# Patient Record
Sex: Female | Born: 1970 | Race: White | Hispanic: No | Marital: Married | Smoking: Current every day smoker
Health system: Southern US, Community
[De-identification: ages and names within clinical notes are randomized; demographics above are authoritative.]

## PROBLEM LIST (undated history)

## (undated) DIAGNOSIS — R200 Anesthesia of skin: Secondary | ICD-10-CM

## (undated) DIAGNOSIS — R51 Headache: Secondary | ICD-10-CM

## (undated) DIAGNOSIS — N390 Urinary tract infection, site not specified: Secondary | ICD-10-CM

## (undated) DIAGNOSIS — R928 Other abnormal and inconclusive findings on diagnostic imaging of breast: Secondary | ICD-10-CM

## (undated) DIAGNOSIS — I517 Cardiomegaly: Secondary | ICD-10-CM

## (undated) DIAGNOSIS — R202 Paresthesia of skin: Secondary | ICD-10-CM

## (undated) DIAGNOSIS — R519 Headache, unspecified: Secondary | ICD-10-CM

## (undated) DIAGNOSIS — K219 Gastro-esophageal reflux disease without esophagitis: Secondary | ICD-10-CM

## (undated) HISTORY — PX: COLONOSCOPY: SHX174

## (undated) HISTORY — PX: WISDOM TOOTH EXTRACTION: SHX21

## (undated) HISTORY — PX: CYST REMOVAL NECK: SHX6281

---

## 1998-03-01 ENCOUNTER — Inpatient Hospital Stay (HOSPITAL_COMMUNITY): Admission: AD | Admit: 1998-03-01 | Discharge: 1998-03-04 | Payer: Self-pay | Admitting: Obstetrics and Gynecology

## 1998-03-01 ENCOUNTER — Inpatient Hospital Stay (HOSPITAL_COMMUNITY): Admission: AD | Admit: 1998-03-01 | Discharge: 1998-03-01 | Payer: Self-pay | Admitting: Obstetrics and Gynecology

## 1998-04-09 ENCOUNTER — Other Ambulatory Visit: Admission: RE | Admit: 1998-04-09 | Discharge: 1998-04-09 | Payer: Self-pay | Admitting: Obstetrics and Gynecology

## 1998-09-19 ENCOUNTER — Emergency Department (HOSPITAL_COMMUNITY): Admission: EM | Admit: 1998-09-19 | Discharge: 1998-09-19 | Payer: Self-pay | Admitting: Emergency Medicine

## 1999-09-22 ENCOUNTER — Other Ambulatory Visit: Admission: RE | Admit: 1999-09-22 | Discharge: 1999-09-22 | Payer: Self-pay | Admitting: Obstetrics and Gynecology

## 2000-08-23 ENCOUNTER — Emergency Department (HOSPITAL_COMMUNITY): Admission: EM | Admit: 2000-08-23 | Discharge: 2000-08-23 | Payer: Self-pay | Admitting: Internal Medicine

## 2000-10-11 ENCOUNTER — Other Ambulatory Visit: Admission: RE | Admit: 2000-10-11 | Discharge: 2000-10-11 | Payer: Self-pay | Admitting: Obstetrics and Gynecology

## 2001-11-15 ENCOUNTER — Other Ambulatory Visit: Admission: RE | Admit: 2001-11-15 | Discharge: 2001-11-15 | Payer: Self-pay | Admitting: Obstetrics and Gynecology

## 2004-12-22 ENCOUNTER — Other Ambulatory Visit: Admission: RE | Admit: 2004-12-22 | Discharge: 2004-12-22 | Payer: Self-pay | Admitting: Obstetrics and Gynecology

## 2007-08-31 ENCOUNTER — Emergency Department (HOSPITAL_COMMUNITY): Admission: EM | Admit: 2007-08-31 | Discharge: 2007-08-31 | Payer: Self-pay | Admitting: Emergency Medicine

## 2010-11-25 ENCOUNTER — Other Ambulatory Visit: Payer: Self-pay

## 2010-11-25 ENCOUNTER — Other Ambulatory Visit (HOSPITAL_COMMUNITY)
Admission: RE | Admit: 2010-11-25 | Discharge: 2010-11-25 | Disposition: A | Discharge status: Home / Self Care | Payer: BC Managed Care – PPO | Source: Ambulatory Visit | Attending: Family Medicine | Admitting: Family Medicine

## 2010-11-25 DIAGNOSIS — Z01419 Encounter for gynecological examination (general) (routine) without abnormal findings: Secondary | ICD-10-CM | POA: Insufficient documentation

## 2011-02-03 ENCOUNTER — Other Ambulatory Visit: Payer: Self-pay | Admitting: Gastroenterology

## 2012-04-23 ENCOUNTER — Other Ambulatory Visit: Payer: Self-pay | Admitting: Physician Assistant

## 2012-04-23 DIAGNOSIS — Z1231 Encounter for screening mammogram for malignant neoplasm of breast: Secondary | ICD-10-CM

## 2012-05-16 ENCOUNTER — Ambulatory Visit
Admission: RE | Admit: 2012-05-16 | Discharge: 2012-05-16 | Disposition: A | Discharge status: Home / Self Care | Payer: BC Managed Care – PPO | Source: Ambulatory Visit | Attending: Physician Assistant | Admitting: Physician Assistant

## 2012-05-16 DIAGNOSIS — Z1231 Encounter for screening mammogram for malignant neoplasm of breast: Secondary | ICD-10-CM

## 2014-05-30 ENCOUNTER — Other Ambulatory Visit: Payer: Self-pay | Admitting: Gastroenterology

## 2017-08-21 NOTE — H&P (Signed)
46 year old G 6 P 1 presents with abnormal uterine bleeding.  Refractory to progesterone treatment Ultrasound unremarkable  No past medical history on file.  Past Surgical History: None  Meds: Prometrium  NKDA  There were no vitals taken for this visit.   General alert and oriented Lung CTAB Car RRR Abdomen is soft and non tender  Pelvic WNL  IMPRESSION: Abnormal uterine bleeding   PLAN LAVH Risks reviewed Consent signed

## 2017-09-05 NOTE — Patient Instructions (Signed)
Ashley Sawyer  09/05/2017      Your procedure is scheduled on Tuesday, Jan. 15, 2019   Report to [facility]  At  5:30 A.M.   Call this number if you have problems the morning of surgery:[phone]              OUR ADDRESS IS [place] , WE ARE LOCATED IN [place].    Remember:   Do not eat food or drink liquids after midnight.   Take these medicines the morning of surgery with A SIP OF WATER   Do not wear jewelry, make-up or nail polish.   Do not wear lotions, powders, or perfumes, or deoderant.   Do not shave 48 hours prior to surgery.  Men may shave face and neck.   Do not bring valuables to the hospital.   [facility] is not responsible for any belongings or valuables.  Contacts, dentures or bridgework may not be worn into surgery.  Leave your suitcase in the car.  After surgery it may be brought to your room.  For patients admitted to the hospital, discharge time will be determined by your treatment team.    Please read over the following fact sheets that you were given.    [place] - Preparing for Surgery Before surgery, you can play an important role.  Because skin is not sterile, your skin needs to be as free of germs as possible.  You can reduce the number of germs on your skin by washing with CHG (chlorahexidine gluconate) soap before surgery.  CHG is an antiseptic cleaner which kills germs and bonds with the skin to continue killing germs even after washing. Please DO NOT use if you have an allergy to CHG or antibacterial soaps.  If your skin becomes reddened/irritated stop using the CHG and inform your nurse when you arrive at Short Stay. Do not shave (including legs and underarms) for at least 48 hours prior to the first CHG shower.  You may shave your face/neck.  Please follow these instructions carefully:  1.  Shower with CHG Soap the night before surgery and the  morning of surgery.  2.   If you choose to wash your hair, wash your hair first as usual with your normal  shampoo.  3.  After you shampoo, rinse your hair and body thoroughly to remove the shampoo.                             4.  Use CHG as you would any other liquid soap.  You can apply chg directly to the skin and wash.  Gently with a scrungie or clean washcloth.  5.  Apply the CHG Soap to your body ONLY FROM THE NECK DOWN.   Do   not use on face/ open                           Wound or open sores. Avoid contact with eyes, ears mouth and   genitals (private parts).                       Wash face,  Genitals (private parts) with your normal soap.             6.  Wash thoroughly, paying special attention to the area where your    surgery  will be performed.  7.  Thoroughly rinse your body with warm water from the neck down.  8.  DO NOT shower/wash with your normal soap after using and rinsing off the CHG Soap.                9.  Pat yourself dry with a clean towel.            10.  Wear clean pajamas.            11.  Place clean sheets on your bed the night of your first shower and do not  sleep with pets. Day of Surgery : Do not apply any lotions/deodorants the morning of surgery.  Please wear clean clothes to the hospital/surgery center.  FAILURE TO FOLLOW THESE INSTRUCTIONS MAY RESULT IN THE CANCELLATION OF YOUR SURGERY  PATIENT SIGNATURE_________________________________  NURSE SIGNATURE__________________________________  ________________________________________________________________________   Ashley Sawyer  An incentive spirometer is a tool that can help keep your lungs clear and active. This tool measures how well you are filling your lungs with each breath. Taking long deep breaths may help reverse or decrease the chance of developing breathing (pulmonary) problems (especially infection) following:  A long period of time when you are unable to move or be active. BEFORE THE PROCEDURE   If the  spirometer includes an indicator to show your best effort, your nurse or respiratory therapist will set it to a desired goal.  If possible, sit up straight or lean slightly forward. Try not to slouch.  Hold the incentive spirometer in an upright position. INSTRUCTIONS FOR USE  1. Sit on the edge of your bed if possible, or sit up as far as you can in bed or on a chair. 2. Hold the incentive spirometer in an upright position. 3. Breathe out normally. 4. Place the mouthpiece in your mouth and seal your lips tightly around it. 5. Breathe in slowly and as deeply as possible, raising the piston or the ball toward the top of the column. 6. Hold your breath for 3-5 seconds or for as long as possible. Allow the piston or ball to fall to the bottom of the column. 7. Remove the mouthpiece from your mouth and breathe out normally. 8. Rest for a few seconds and repeat Steps 1 through 7 at least 10 times every 1-2 hours when you are awake. Take your time and take a few normal breaths between deep breaths. 9. The spirometer may include an indicator to show your best effort. Use the indicator as a goal to work toward during each repetition. 10. After each set of 10 deep breaths, practice coughing to be sure your lungs are clear. If you have an incision (the cut made at the time of surgery), support your incision when coughing by placing a pillow or rolled up towels firmly against it. Once you are able to get out of bed, walk around indoors and cough well. You may stop using the incentive spirometer when instructed by your caregiver.  RISKS AND COMPLICATIONS  Take your time so you do not get dizzy or light-headed.  If you are in pain, you may need to take or ask for pain medication before doing incentive spirometry. It is harder to take a deep breath if you are having pain. AFTER USE  Rest and breathe slowly and easily.  It can be helpful to keep track of a log of  your progress. Your caregiver can provide  you with a simple table to help with this. If you are using the spirometer at home, follow these instructions: Ashley Sawyer IF:   You are having difficultly using the spirometer.  You have trouble using the spirometer as often as instructed.  Your pain medication is not giving enough relief while using the spirometer.  You develop fever of 100.5 F (38.1 C) or higher. SEEK IMMEDIATE MEDICAL CARE IF:   You cough up bloody sputum that had not been present before.  You develop fever of 102 F (38.9 C) or greater.  You develop worsening pain at or near the incision site. MAKE SURE YOU:   Understand these instructions.  Will watch your condition.  Will get help right away if you are not doing well or get worse. Document Released: 12/26/2006 Document Revised: 11/07/2011 Document Reviewed: 02/26/2007 Exit[employer] Patient Information 2014 Exit[employer], [place].   ________________________________________________________________________  WHAT IS A BLOOD TRANSFUSION? Blood Transfusion Information  A transfusion is the replacement of blood or some of its parts. Blood is made up of multiple cells which provide different functions.  Red blood cells carry oxygen and are used for blood loss replacement.  White blood cells fight against infection.  Platelets control bleeding.  Plasma helps clot blood.  Other blood products are available for specialized needs, such as hemophilia or other clotting disorders. BEFORE THE TRANSFUSION  Who gives blood for transfusions?   Healthy volunteers who are fully evaluated to make sure their blood is safe. This is blood bank blood. Transfusion therapy is the safest it has ever been in the practice of medicine. Before blood is taken from a donor, a complete history is taken to make sure that person has no history of diseases nor engages in risky social behavior (examples are intravenous drug use or sexual activity with multiple partners). The donor's  travel history is screened to minimize risk of transmitting infections, such as malaria. The donated blood is tested for signs of infectious diseases, such as HIV and hepatitis. The blood is then tested to be sure it is compatible with you in order to minimize the chance of a transfusion reaction. If you or a relative donates blood, this is often done in anticipation of surgery and is not appropriate for emergency situations. It takes many days to process the donated blood. RISKS AND COMPLICATIONS Although transfusion therapy is very safe and saves many lives, the main dangers of transfusion include:   Getting an infectious disease.  Developing a transfusion reaction. This is an allergic reaction to something in the blood you were given. Every precaution is taken to prevent this. The decision to have a blood transfusion has been considered carefully by your caregiver before blood is given. Blood is not given unless the benefits outweigh the risks. AFTER THE TRANSFUSION  Right after receiving a blood transfusion, you will usually feel much better and more energetic. This is especially true if your red blood cells have gotten low (anemic). The transfusion raises the level of the red blood cells which carry oxygen, and this usually causes an energy increase.  The nurse administering the transfusion will monitor you carefully for complications. HOME CARE INSTRUCTIONS  No special instructions are needed after a transfusion. You may find your energy is better. Speak with your caregiver about any limitations on activity for underlying diseases you may have. SEEK MEDICAL CARE IF:   Your condition is not improving after your transfusion.  You develop redness  or irritation at the intravenous (IV) site. SEEK IMMEDIATE MEDICAL CARE IF:  Any of the following symptoms occur over the next 12 hours:  Shaking chills.  You have a temperature by mouth above 102 F (38.9 C), not controlled by  medicine.  Chest, back, or muscle pain.  People around you feel you are not acting correctly or are confused.  Shortness of breath or difficulty breathing.  Dizziness and fainting.  You get a rash or develop hives.  You have a decrease in urine output.  Your urine turns a dark color or changes to pink, red, or brown. Any of the following symptoms occur over the next 10 days:  You have a temperature by mouth above 102 F (38.9 C), not controlled by medicine.  Shortness of breath.  Weakness after normal activity.  The white part of the eye turns yellow (jaundice).  You have a decrease in the amount of urine or are urinating less often.  Your urine turns a dark color or changes to pink, red, or brown. Document Released: 08/12/2000 Document Revised: 11/07/2011 Document Reviewed: 03/31/2008 [facility] Patient Information 2014 [place], [place].  _______________________________________________________________________

## 2017-09-07 ENCOUNTER — Other Ambulatory Visit: Payer: Self-pay

## 2017-09-07 ENCOUNTER — Encounter (HOSPITAL_COMMUNITY): Payer: Self-pay

## 2017-09-07 ENCOUNTER — Encounter (INDEPENDENT_AMBULATORY_CARE_PROVIDER_SITE_OTHER): Payer: Self-pay

## 2017-09-07 ENCOUNTER — Encounter (HOSPITAL_COMMUNITY)
Admission: RE | Admit: 2017-09-07 | Discharge: 2017-09-07 | Disposition: A | Discharge status: Home / Self Care | Payer: 59 | Source: Ambulatory Visit | Attending: Obstetrics and Gynecology | Admitting: Obstetrics and Gynecology

## 2017-09-07 DIAGNOSIS — Z0183 Encounter for blood typing: Secondary | ICD-10-CM | POA: Diagnosis not present

## 2017-09-07 DIAGNOSIS — N939 Abnormal uterine and vaginal bleeding, unspecified: Secondary | ICD-10-CM | POA: Diagnosis not present

## 2017-09-07 DIAGNOSIS — Z01812 Encounter for preprocedural laboratory examination: Secondary | ICD-10-CM | POA: Insufficient documentation

## 2017-09-07 HISTORY — DX: Anesthesia of skin: R20.0

## 2017-09-07 HISTORY — DX: Gastro-esophageal reflux disease without esophagitis: K21.9

## 2017-09-07 HISTORY — DX: Headache: R51

## 2017-09-07 HISTORY — DX: Cardiomegaly: I51.7

## 2017-09-07 HISTORY — DX: Headache, unspecified: R51.9

## 2017-09-07 HISTORY — DX: Urinary tract infection, site not specified: N39.0

## 2017-09-07 HISTORY — DX: Paresthesia of skin: R20.2

## 2017-09-07 LAB — CBC
HCT: 36.6 % (ref 36.0–46.0)
HEMOGLOBIN: 11.9 g/dL — AB (ref 12.0–15.0)
MCH: 27.9 pg (ref 26.0–34.0)
MCHC: 32.5 g/dL (ref 30.0–36.0)
MCV: 85.7 fL (ref 78.0–100.0)
Platelets: 283 10*3/uL (ref 150–400)
RBC: 4.27 MIL/uL (ref 3.87–5.11)
RDW: 15.4 % (ref 11.5–15.5)
WBC: 6.8 10*3/uL (ref 4.0–10.5)

## 2017-09-08 LAB — ABO/RH: ABO/RH(D): A POS

## 2017-09-11 NOTE — Anesthesia Preprocedure Evaluation (Addendum)
Anesthesia Evaluation  Patient identified by MRN, date of birth, ID band Patient awake    Reviewed: Allergy & Precautions, NPO status , Patient's Chart, lab work & pertinent test results  Airway Mallampati: II  TM Distance: >3 FB Neck ROM: Full    Dental no notable dental hx. (+) Dental Advisory Given   Pulmonary neg pulmonary ROS, former smoker,    Pulmonary exam normal        Cardiovascular negative cardio ROS Normal cardiovascular exam     Neuro/Psych negative neurological ROS  negative psych ROS   GI/Hepatic Neg liver ROS, GERD  ,  Endo/Other  negative endocrine ROS  Renal/GU negative Renal ROS     Musculoskeletal negative musculoskeletal ROS (+)   Abdominal   Peds  Hematology negative hematology ROS (+)   Anesthesia Other Findings   Reproductive/Obstetrics negative OB ROS                            Anesthesia Physical Anesthesia Plan  ASA: II  Anesthesia Plan: General   Post-op Pain Management:    Induction: Intravenous  PONV Risk Score and Plan: 4 or greater and Treatment may vary due to age or medical condition, Ondansetron, Dexamethasone, Scopolamine patch - Pre-op and Diphenhydramine  Airway Management Planned: Oral ETT  Additional Equipment:   Intra-op Plan:   Post-operative Plan: Extubation in OR  Informed Consent: I have reviewed the patients History and Physical, chart, labs and discussed the procedure including the risks, benefits and alternatives for the proposed anesthesia with the patient or authorized representative who has indicated his/her understanding and acceptance.   Dental advisory given  Plan Discussed with: CRNA  Anesthesia Plan Comments:        Anesthesia Quick Evaluation

## 2017-09-12 ENCOUNTER — Encounter (HOSPITAL_BASED_OUTPATIENT_CLINIC_OR_DEPARTMENT_OTHER)
Admission: RE | Disposition: A | Discharge status: Home / Self Care | Payer: Self-pay | Source: Ambulatory Visit | Attending: Obstetrics and Gynecology

## 2017-09-12 ENCOUNTER — Ambulatory Visit (HOSPITAL_BASED_OUTPATIENT_CLINIC_OR_DEPARTMENT_OTHER): Payer: 59 | Admitting: Anesthesiology

## 2017-09-12 ENCOUNTER — Encounter (HOSPITAL_BASED_OUTPATIENT_CLINIC_OR_DEPARTMENT_OTHER): Payer: Self-pay

## 2017-09-12 ENCOUNTER — Ambulatory Visit (HOSPITAL_BASED_OUTPATIENT_CLINIC_OR_DEPARTMENT_OTHER)
Admission: RE | Admit: 2017-09-12 | Discharge: 2017-09-13 | Disposition: A | Discharge status: Home / Self Care | Payer: 59 | Source: Ambulatory Visit | Attending: Obstetrics and Gynecology | Admitting: Obstetrics and Gynecology

## 2017-09-12 DIAGNOSIS — Z79899 Other long term (current) drug therapy: Secondary | ICD-10-CM | POA: Diagnosis not present

## 2017-09-12 DIAGNOSIS — N939 Abnormal uterine and vaginal bleeding, unspecified: Secondary | ICD-10-CM | POA: Diagnosis not present

## 2017-09-12 DIAGNOSIS — N800 Endometriosis of uterus: Secondary | ICD-10-CM | POA: Insufficient documentation

## 2017-09-12 DIAGNOSIS — D259 Leiomyoma of uterus, unspecified: Secondary | ICD-10-CM | POA: Insufficient documentation

## 2017-09-12 DIAGNOSIS — Z87891 Personal history of nicotine dependence: Secondary | ICD-10-CM | POA: Insufficient documentation

## 2017-09-12 HISTORY — PX: LAPAROSCOPIC ASSISTED VAGINAL HYSTERECTOMY: SHX5398

## 2017-09-12 LAB — TYPE AND SCREEN
ABO/RH(D): A POS
ANTIBODY SCREEN: NEGATIVE

## 2017-09-12 LAB — POCT PREGNANCY, URINE: PREG TEST UR: NEGATIVE

## 2017-09-12 SURGERY — HYSTERECTOMY, VAGINAL, LAPAROSCOPY-ASSISTED
Anesthesia: General | Site: Vagina

## 2017-09-12 MED ORDER — SCOPOLAMINE 1 MG/3DAYS TD PT72
1.0000 | MEDICATED_PATCH | TRANSDERMAL | Status: DC
  Administered 2017-09-12: 1.5 mg via TRANSDERMAL
  Filled 2017-09-12: qty 1

## 2017-09-12 MED ORDER — LIDOCAINE HCL 4 % MT SOLN
OROMUCOSAL | Status: DC | PRN
  Administered 2017-09-12: 2 mL via TOPICAL

## 2017-09-12 MED ORDER — PROPOFOL 10 MG/ML IV BOLUS
INTRAVENOUS | Status: DC | PRN
  Administered 2017-09-12: 150 mg via INTRAVENOUS

## 2017-09-12 MED ORDER — ACETAMINOPHEN 500 MG PO TABS
ORAL_TABLET | ORAL | Status: AC
  Filled 2017-09-12: qty 1

## 2017-09-12 MED ORDER — METOCLOPRAMIDE HCL 5 MG/ML IJ SOLN
INTRAMUSCULAR | Status: AC
  Filled 2017-09-12: qty 2

## 2017-09-12 MED ORDER — LACTATED RINGERS IV SOLN
INTRAVENOUS | Status: DC
  Administered 2017-09-12 (×3): via INTRAVENOUS
  Filled 2017-09-12: qty 1000

## 2017-09-12 MED ORDER — DIPHENHYDRAMINE HCL 50 MG/ML IJ SOLN
12.5000 mg | Freq: Four times a day (QID) | INTRAMUSCULAR | Status: DC | PRN
  Filled 2017-09-12: qty 0.25

## 2017-09-12 MED ORDER — TRAMADOL HCL 50 MG PO TABS
ORAL_TABLET | ORAL | Status: AC
  Filled 2017-09-12: qty 2

## 2017-09-12 MED ORDER — MEPERIDINE HCL 25 MG/ML IJ SOLN
INTRAMUSCULAR | Status: AC
  Filled 2017-09-12: qty 1

## 2017-09-12 MED ORDER — BUPIVACAINE HCL (PF) 0.25 % IJ SOLN
INTRAMUSCULAR | Status: DC | PRN
  Administered 2017-09-12: 5 mL

## 2017-09-12 MED ORDER — PROPOFOL 10 MG/ML IV BOLUS
INTRAVENOUS | Status: AC
  Filled 2017-09-12: qty 40

## 2017-09-12 MED ORDER — FENTANYL CITRATE (PF) 250 MCG/5ML IJ SOLN
INTRAMUSCULAR | Status: AC
  Filled 2017-09-12: qty 5

## 2017-09-12 MED ORDER — ONDANSETRON HCL 4 MG/2ML IJ SOLN
INTRAMUSCULAR | Status: AC
  Filled 2017-09-12: qty 2

## 2017-09-12 MED ORDER — KETOROLAC TROMETHAMINE 30 MG/ML IJ SOLN
INTRAMUSCULAR | Status: AC
  Filled 2017-09-12: qty 1

## 2017-09-12 MED ORDER — SUGAMMADEX SODIUM 200 MG/2ML IV SOLN
INTRAVENOUS | Status: AC
  Filled 2017-09-12: qty 2

## 2017-09-12 MED ORDER — LIDOCAINE 2% (20 MG/ML) 5 ML SYRINGE
INTRAMUSCULAR | Status: AC
  Filled 2017-09-12: qty 10

## 2017-09-12 MED ORDER — ROCURONIUM BROMIDE 10 MG/ML (PF) SYRINGE
PREFILLED_SYRINGE | INTRAVENOUS | Status: DC | PRN
  Administered 2017-09-12: 20 mg via INTRAVENOUS
  Administered 2017-09-12: 50 mg via INTRAVENOUS

## 2017-09-12 MED ORDER — LACTATED RINGERS IV SOLN
INTRAVENOUS | Status: DC
  Filled 2017-09-12: qty 1000

## 2017-09-12 MED ORDER — KETOROLAC TROMETHAMINE 30 MG/ML IJ SOLN
INTRAMUSCULAR | Status: DC | PRN
  Administered 2017-09-12: 30 mg via INTRAVENOUS

## 2017-09-12 MED ORDER — ONDANSETRON HCL 4 MG/2ML IJ SOLN
INTRAMUSCULAR | Status: DC | PRN
  Administered 2017-09-12: 4 mg via INTRAVENOUS

## 2017-09-12 MED ORDER — ROCURONIUM BROMIDE 50 MG/5ML IV SOSY
PREFILLED_SYRINGE | INTRAVENOUS | Status: AC
  Filled 2017-09-12: qty 5

## 2017-09-12 MED ORDER — MENTHOL 3 MG MT LOZG
1.0000 | LOZENGE | OROMUCOSAL | Status: DC | PRN
  Filled 2017-09-12: qty 9

## 2017-09-12 MED ORDER — DEXAMETHASONE SODIUM PHOSPHATE 10 MG/ML IJ SOLN
INTRAMUSCULAR | Status: DC | PRN
  Administered 2017-09-12: 10 mg via INTRAVENOUS

## 2017-09-12 MED ORDER — METOCLOPRAMIDE HCL 5 MG/ML IJ SOLN
INTRAMUSCULAR | Status: DC | PRN
  Administered 2017-09-12 (×2): 5 mg via INTRAVENOUS

## 2017-09-12 MED ORDER — IBUPROFEN 600 MG PO TABS
600.0000 mg | ORAL_TABLET | Freq: Four times a day (QID) | ORAL | Status: DC | PRN
  Filled 2017-09-12: qty 1

## 2017-09-12 MED ORDER — KETOROLAC TROMETHAMINE 30 MG/ML IJ SOLN
30.0000 mg | Freq: Once | INTRAMUSCULAR | Status: AC
  Administered 2017-09-12: 30 mg via INTRAVENOUS
  Filled 2017-09-12: qty 1

## 2017-09-12 MED ORDER — FENTANYL CITRATE (PF) 100 MCG/2ML IJ SOLN
INTRAMUSCULAR | Status: DC | PRN
  Administered 2017-09-12: 100 ug via INTRAVENOUS
  Administered 2017-09-12 (×3): 50 ug via INTRAVENOUS

## 2017-09-12 MED ORDER — KETOROLAC TROMETHAMINE 30 MG/ML IJ SOLN
30.0000 mg | Freq: Once | INTRAMUSCULAR | Status: DC | PRN
  Filled 2017-09-12: qty 1

## 2017-09-12 MED ORDER — HYDROMORPHONE HCL 1 MG/ML IJ SOLN
0.2500 mg | INTRAMUSCULAR | Status: DC | PRN
  Administered 2017-09-12: 0.25 mg via INTRAVENOUS
  Filled 2017-09-12: qty 0.5

## 2017-09-12 MED ORDER — LIDOCAINE 2% (20 MG/ML) 5 ML SYRINGE
INTRAMUSCULAR | Status: DC | PRN
  Administered 2017-09-12: 60 mg via INTRAVENOUS

## 2017-09-12 MED ORDER — TRAMADOL HCL 50 MG PO TABS
ORAL_TABLET | ORAL | Status: AC
  Filled 2017-09-12: qty 1

## 2017-09-12 MED ORDER — DEXAMETHASONE SODIUM PHOSPHATE 10 MG/ML IJ SOLN
INTRAMUSCULAR | Status: AC
  Filled 2017-09-12: qty 1

## 2017-09-12 MED ORDER — DIPHENHYDRAMINE HCL 12.5 MG/5ML PO ELIX
12.5000 mg | ORAL_SOLUTION | Freq: Four times a day (QID) | ORAL | Status: DC | PRN
  Administered 2017-09-12: 12.5 mg via ORAL
  Filled 2017-09-12 (×3): qty 5

## 2017-09-12 MED ORDER — TRAMADOL HCL 50 MG PO TABS
50.0000 mg | ORAL_TABLET | Freq: Four times a day (QID) | ORAL | Status: DC | PRN
  Administered 2017-09-12 – 2017-09-13 (×3): 50 mg via ORAL
  Filled 2017-09-12: qty 1

## 2017-09-12 MED ORDER — ACETAMINOPHEN 325 MG PO TABS
ORAL_TABLET | ORAL | Status: DC | PRN
  Administered 2017-09-12: 1000 mg via ORAL

## 2017-09-12 MED ORDER — PROMETHAZINE HCL 25 MG/ML IJ SOLN
6.2500 mg | INTRAMUSCULAR | Status: DC | PRN
  Filled 2017-09-12: qty 1

## 2017-09-12 MED ORDER — ONDANSETRON HCL 4 MG/2ML IJ SOLN
4.0000 mg | Freq: Four times a day (QID) | INTRAMUSCULAR | Status: DC | PRN
  Filled 2017-09-12: qty 2

## 2017-09-12 MED ORDER — SODIUM CHLORIDE 0.9 % IR SOLN
Status: DC | PRN
  Administered 2017-09-12: 1000 mL

## 2017-09-12 MED ORDER — MIDAZOLAM HCL 2 MG/2ML IJ SOLN
INTRAMUSCULAR | Status: AC
  Filled 2017-09-12: qty 2

## 2017-09-12 MED ORDER — SCOPOLAMINE 1 MG/3DAYS TD PT72
MEDICATED_PATCH | TRANSDERMAL | Status: AC
  Filled 2017-09-12: qty 1

## 2017-09-12 MED ORDER — MIDAZOLAM HCL 2 MG/2ML IJ SOLN
INTRAMUSCULAR | Status: DC | PRN
  Administered 2017-09-12: 2 mg via INTRAVENOUS

## 2017-09-12 MED ORDER — HYDROMORPHONE HCL 1 MG/ML IJ SOLN
INTRAMUSCULAR | Status: AC
  Filled 2017-09-12: qty 1

## 2017-09-12 MED ORDER — ARTIFICIAL TEARS OPHTHALMIC OINT
TOPICAL_OINTMENT | OPHTHALMIC | Status: AC
  Filled 2017-09-12: qty 3.5

## 2017-09-12 MED ORDER — CEFOTETAN DISODIUM-DEXTROSE 2-2.08 GM-%(50ML) IV SOLR
INTRAVENOUS | Status: AC
  Filled 2017-09-12: qty 50

## 2017-09-12 MED ORDER — SUGAMMADEX SODIUM 200 MG/2ML IV SOLN
INTRAVENOUS | Status: DC | PRN
  Administered 2017-09-12: 150 mg via INTRAVENOUS

## 2017-09-12 MED ORDER — DEXTROSE 5 % IV SOLN
2.0000 g | INTRAVENOUS | Status: AC
  Administered 2017-09-12: 2 g via INTRAVENOUS
  Filled 2017-09-12: qty 2

## 2017-09-12 MED ORDER — MEPERIDINE HCL 25 MG/ML IJ SOLN
6.2500 mg | INTRAMUSCULAR | Status: DC | PRN
  Filled 2017-09-12: qty 1

## 2017-09-12 SURGICAL SUPPLY — 62 items
ADH SKN CLS APL DERMABOND .7 (GAUZE/BANDAGES/DRESSINGS) ×1
APL SRG 38 LTWT LNG FL B (MISCELLANEOUS) ×1
APPLICATOR ARISTA FLEXITIP XL (MISCELLANEOUS) ×2 IMPLANT
BARRIER ADHS 3X4 INTERCEED (GAUZE/BANDAGES/DRESSINGS) IMPLANT
BLADE CLIPPER SURG (BLADE) IMPLANT
BRR ADH 4X3 ABS CNTRL BYND (GAUZE/BANDAGES/DRESSINGS)
CANISTER SUCT 3000ML PPV (MISCELLANEOUS) ×2 IMPLANT
COVER BACK TABLE 80X110 HD (DRAPES) ×3 IMPLANT
COVER MAYO STAND STRL (DRAPES) ×6 IMPLANT
DERMABOND ADVANCED (GAUZE/BANDAGES/DRESSINGS) ×2
DERMABOND ADVANCED .7 DNX12 (GAUZE/BANDAGES/DRESSINGS) ×1 IMPLANT
DRSG COVADERM PLUS 2X2 (GAUZE/BANDAGES/DRESSINGS) IMPLANT
DRSG OPSITE POSTOP 3X4 (GAUZE/BANDAGES/DRESSINGS) ×3 IMPLANT
DURAPREP 26ML APPLICATOR (WOUND CARE) ×3 IMPLANT
ELECT REM PT RETURN 9FT ADLT (ELECTROSURGICAL) ×3
ELECTRODE REM PT RTRN 9FT ADLT (ELECTROSURGICAL) ×1 IMPLANT
GLOVE BIO SURGEON STRL SZ 6.5 (GLOVE) ×8 IMPLANT
GLOVE BIO SURGEON STRL SZ8.5 (GLOVE) ×4 IMPLANT
GLOVE BIO SURGEONS STRL SZ 6.5 (GLOVE) ×5
GLOVE BIOGEL PI IND STRL 6.5 (GLOVE) IMPLANT
GLOVE BIOGEL PI IND STRL 7.5 (GLOVE) IMPLANT
GLOVE BIOGEL PI IND STRL 8.5 (GLOVE) IMPLANT
GLOVE BIOGEL PI INDICATOR 6.5 (GLOVE) ×4
GLOVE BIOGEL PI INDICATOR 7.5 (GLOVE) ×4
GLOVE BIOGEL PI INDICATOR 8.5 (GLOVE) ×8
GLOVE ECLIPSE 6.5 STRL STRAW (GLOVE) ×6 IMPLANT
GOWN STRL REUS W/TWL LRG LVL3 (GOWN DISPOSABLE) ×4 IMPLANT
GOWN STRL REUS W/TWL XL LVL3 (GOWN DISPOSABLE) ×4 IMPLANT
HEMOSTAT ARISTA ABSORB 3G PWDR (MISCELLANEOUS) ×2 IMPLANT
HOLDER FOLEY CATH W/STRAP (MISCELLANEOUS) ×3 IMPLANT
IV SOD CHL 0.9% 1000ML (IV SOLUTION) ×2 IMPLANT
KIT RM TURNOVER CYSTO AR (KITS) ×3 IMPLANT
LEGGING LITHOTOMY PAIR STRL (DRAPES) ×3 IMPLANT
NDL INSUFFLATION 14GA 120MM (NEEDLE) ×1 IMPLANT
NEEDLE INSUFFLATION 14GA 120MM (NEEDLE) ×3 IMPLANT
NS IRRIG 500ML POUR BTL (IV SOLUTION) ×3 IMPLANT
PACK LAVH (CUSTOM PROCEDURE TRAY) ×3 IMPLANT
PACK ROBOTIC GOWN (GOWN DISPOSABLE) ×1 IMPLANT
PACK TRENDGUARD 450 HYBRID PRO (MISCELLANEOUS) IMPLANT
PAD OB MATERNITY 4.3X12.25 (PERSONAL CARE ITEMS) ×3 IMPLANT
PAD PREP 24X48 CUFFED NSTRL (MISCELLANEOUS) ×3 IMPLANT
SEALER TISSUE G2 CVD JAW 45CM (ENDOMECHANICALS) ×3 IMPLANT
SET IRRIG TUBING LAPAROSCOPIC (IRRIGATION / IRRIGATOR) ×3 IMPLANT
SUT VIC AB 0 CT1 18XCR BRD8 (SUTURE) ×2 IMPLANT
SUT VIC AB 0 CT1 36 (SUTURE) ×8 IMPLANT
SUT VIC AB 0 CT1 8-18 (SUTURE) ×9
SUT VIC AB 3-0 PS2 18 (SUTURE) ×3
SUT VIC AB 3-0 PS2 18XBRD (SUTURE) ×1 IMPLANT
SUT VIC AB 3-0 SH 27 (SUTURE)
SUT VIC AB 3-0 SH 27X BRD (SUTURE) IMPLANT
SUT VICRYL 0 TIES 12 18 (SUTURE) ×3 IMPLANT
SUT VICRYL 0 UR6 27IN ABS (SUTURE) ×3 IMPLANT
SYR BULB IRRIGATION 50ML (SYRINGE) IMPLANT
TOWEL OR 17X24 6PK STRL BLUE (TOWEL DISPOSABLE) ×6 IMPLANT
TRAY FOLEY CATH SILVER 14FR (SET/KITS/TRAYS/PACK) ×3 IMPLANT
TRENDGUARD 450 HYBRID PRO PACK (MISCELLANEOUS) ×3
TROCAR OPTI TIP 5M 100M (ENDOMECHANICALS) ×3 IMPLANT
TROCAR XCEL BLUNT TIP 100MML (ENDOMECHANICALS) IMPLANT
TROCAR XCEL DIL TIP R 11M (ENDOMECHANICALS) ×3 IMPLANT
TUBING INSUF HEATED (TUBING) ×3 IMPLANT
WARMER LAPAROSCOPE (MISCELLANEOUS) ×3 IMPLANT
WATER STERILE IRR 500ML POUR (IV SOLUTION) ×3 IMPLANT

## 2017-09-12 NOTE — Anesthesia Postprocedure Evaluation (Signed)
Anesthesia Post Note  Patient: Ashley Sawyer  Procedure(s) Performed: LAPAROSCOPIC ASSISTED VAGINAL HYSTERECTOMY (N/A Vagina )     Patient location during evaluation: PACU Anesthesia Type: General Level of consciousness: awake and alert Pain management: pain level controlled Vital Signs Assessment: post-procedure vital signs reviewed and stable Respiratory status: spontaneous breathing, nonlabored ventilation and respiratory function stable Cardiovascular status: blood pressure returned to baseline and stable Postop Assessment: no apparent nausea or vomiting Anesthetic complications: no    Last Vitals:  Vitals:   09/12/17 1100 09/12/17 1115  BP: 119/66 124/61  Pulse: 91 95  Resp: 15 18  Temp:  37.2 C  SpO2: 99% 99%    Last Pain:  Vitals:   09/12/17 1115  TempSrc:   PainSc: Berwind Brock

## 2017-09-12 NOTE — Progress Notes (Signed)
Patient sitting upright eating a sandwich.  BP 119/63 (BP Location: Left Arm)   Pulse (!) 105   Temp 98.3 F (36.8 C)   Resp 16   Ht 5\' 4"  (1.626 m)   Wt 70.7 kg (155 lb 14.4 oz)   LMP 08/25/2017 (Approximate)   SpO2 97%   BMI 26.76 kg/m  Results for orders placed or performed during the hospital encounter of 09/12/17 (from the past 24 hour(s))  Pregnancy, urine POC     Status: None   Collection Time: 09/12/17  6:06 AM  Result Value Ref Range   Preg Test, Ur NEGATIVE NEGATIVE   Abdomen soft and non tender Bandage clean and dry  POD # 0  Doing well Routine care Remove foley Discharge home tomorrow

## 2017-09-12 NOTE — Transfer of Care (Signed)
  Last Vitals:  Vitals:   09/12/17 0531 09/12/17 0923  BP: 130/73 129/77  Pulse: 81 97  Resp: 16 17  Temp: 36.6 C 36.7 C  SpO2: 97% 93%    Last Pain:  Vitals:   09/12/17 0531  TempSrc: Oral      Patients Stated Pain Goal: 4 (09/12/17 0546)  Immediate Anesthesia Transfer of Care Note  Patient: Ashley Sawyer  Procedure(s) Performed: Procedure(s) (LRB): LAPAROSCOPIC ASSISTED VAGINAL HYSTERECTOMY (N/A)  Patient Location: PACU  Anesthesia Type: General  Level of Consciousness: awake, alert  and oriented  Airway & Oxygen Therapy: Patient Spontanous Breathing and Patient connected to nasal cannula oxygen  Post-op Assessment: Report given to PACU RN and Post -op Vital signs reviewed and stable  Post vital signs: Reviewed and stable  Complications: No apparent anesthesia complications

## 2017-09-12 NOTE — Progress Notes (Signed)
No changes Will proceed with LAVH, BS Consent signed Risks reviewed

## 2017-09-12 NOTE — Brief Op Note (Signed)
09/12/2017  9:11 AM  PATIENT:  Ashley Sawyer  47 y.o. female  PRE-OPERATIVE DIAGNOSIS:  AUB   POST-OPERATIVE DIAGNOSIS:  AUB   PROCEDURE: LAVH Bilateral salpingectomy  SURGEON:  Surgeon(s) and Role:    * Dian Queen, MD - Primary    * Maisie Fus, MD - Assisting  PHYSICIAN ASSISTANT:   ASSISTANTS: none   ANESTHESIA:   general  EBL:  400 mL   BLOOD ADMINISTERED:none  DRAINS: Urinary Catheter (Foley)   LOCAL MEDICATIONS USED:  LIDOCAINE   SPECIMEN:  Source of Specimen:  cervix, uterus and fallopian tubes  DISPOSITION OF SPECIMEN:  PATHOLOGY  COUNTS:  YES  TOURNIQUET:  * No tourniquets in log *  DICTATION: .Other Dictation: Dictation Number [id number]  PLAN OF CARE: Admit to inpatient   PATIENT DISPOSITION:  PACU - hemodynamically stable.   Delay start of Pharmacological VTE agent (>24hrs) due to surgical blood loss or risk of bleeding: not applicable

## 2017-09-12 NOTE — Anesthesia Procedure Notes (Signed)
Procedure Name: Intubation Date/Time: 09/12/2017 7:29 AM Performed by: Duane Boston, MD Pre-anesthesia Checklist: Patient identified, Emergency Drugs available, Suction available and Patient being monitored Patient Re-evaluated:Patient Re-evaluated prior to induction Oxygen Delivery Method: Circle system utilized Preoxygenation: Pre-oxygenation with 100% oxygen Induction Type: IV induction Ventilation: Mask ventilation without difficulty Laryngoscope Size: Mac and 3 Grade View: Grade I Tube type: Oral Tube size: 7.0 mm Number of attempts: 1 Airway Equipment and Method: Stylet and LTA kit utilized Placement Confirmation: ETT inserted through vocal cords under direct vision,  positive ETCO2 and breath sounds checked- equal and bilateral Secured at: 21 cm Tube secured with: Tape Dental Injury: Teeth and Oropharynx as per pre-operative assessment

## 2017-09-13 ENCOUNTER — Encounter (HOSPITAL_BASED_OUTPATIENT_CLINIC_OR_DEPARTMENT_OTHER): Payer: Self-pay | Admitting: Obstetrics and Gynecology

## 2017-09-13 DIAGNOSIS — N939 Abnormal uterine and vaginal bleeding, unspecified: Secondary | ICD-10-CM | POA: Diagnosis not present

## 2017-09-13 LAB — CBC
HEMATOCRIT: 28.3 % — AB (ref 36.0–46.0)
Hemoglobin: 9.3 g/dL — ABNORMAL LOW (ref 12.0–15.0)
MCH: 28.3 pg (ref 26.0–34.0)
MCHC: 32.9 g/dL (ref 30.0–36.0)
MCV: 86 fL (ref 78.0–100.0)
Platelets: 233 10*3/uL (ref 150–400)
RBC: 3.29 MIL/uL — ABNORMAL LOW (ref 3.87–5.11)
RDW: 15.5 % (ref 11.5–15.5)
WBC: 17 10*3/uL — ABNORMAL HIGH (ref 4.0–10.5)

## 2017-09-13 MED ORDER — TRAMADOL HCL 50 MG PO TABS
ORAL_TABLET | ORAL | Status: AC
  Filled 2017-09-13: qty 1

## 2017-09-13 MED ORDER — TRAMADOL HCL 50 MG PO TABS: 50.0000 mg | ORAL_TABLET | Freq: Four times a day (QID) | ORAL | 0 refills | Status: DC | PRN

## 2017-09-13 NOTE — Discharge Summary (Signed)
Admission Diagnosis: Abnormal uterine bleeding  Discharge Diagnosis: Same  Hospital course: 47 year old female admitted for LAVH, BS. She did very well post op. By POD # 1 she was ambulating, voiding and tolerating pain with only Tramadol  BP 109/65 (BP Location: Left Arm)   Pulse 90   Temp 98.7 F (37.1 C) (Oral)   Resp 16   Ht 5\' 4"  (1.626 m)   Wt 70.7 kg (155 lb 14.4 oz)   LMP 08/25/2017 (Approximate)   SpO2 97%   BMI 26.76 kg/m  Abdomen soft and non tender  Results for orders placed or performed during the hospital encounter of 09/12/17 (from the past 24 hour(s))  CBC     Status: Abnormal   Collection Time: 09/13/17  5:15 AM  Result Value Ref Range   WBC 17.0 (H) 4.0 - 10.5 K/uL   RBC 3.29 (L) 3.87 - 5.11 MIL/uL   Hemoglobin 9.3 (L) 12.0 - 15.0 g/dL   HCT 28.3 (L) 36.0 - 46.0 %   MCV 86.0 78.0 - 100.0 fL   MCH 28.3 26.0 - 34.0 pg   MCHC 32.9 30.0 - 36.0 g/dL   RDW 15.5 11.5 - 15.5 %   Platelets 233 150 - 400 K/uL   She was discharged home in good condition Follow up in 1 week Rx Tramadol

## 2017-09-13 NOTE — Op Note (Signed)
NAME:  Ashley Sawyer, Ashley Sawyer NO.:  [id number]  MEDICAL RECORD NO.:  [id number]  LOCATION:                                 FACILITY:  PHYSICIAN:  Nupur Hohman L. Helane Rima, M.D.    DATE OF BIRTH:  DATE OF PROCEDURE:  09/12/2017 DATE OF DISCHARGE:                              OPERATIVE REPORT   PREOPERATIVE DIAGNOSIS:  Abnormal uterine bleeding.  POSTOPERATIVE DIAGNOSIS:  Abnormal uterine bleeding.  PROCEDURE:  Laparoscopic-assisted vaginal hysterectomy and bilateral salpingectomy.  SURGEON:  Welby Montminy L. Helane Rima, M.D.  ASSISTANTMaisie Fus, M.D.  ANESTHESIA:  General.  EBL:  400 mL.  COMPLICATIONS:  None.  DRAINS:  Foley catheter.  PATHOLOGY:  Uterus, cervix, tubes sent to Pathology.  DESCRIPTION OF PROCEDURE:  The patient was taken to the operating room. After informed consent was obtained, she was prepped and draped.  After she was intubated, a Foley catheter was inserted and uterine manipulator was inserted.  Local was infiltrated at the umbilicus, a small incision was made.  The Veress needle was inserted and intraperitoneal placement was confirmed.  Pneumoperitoneum was performed.  An 11-mm trocar was inserted.  The scope was inserted through the trocar and pelvis was inspected.  The pelvis was normal.  Ovaries appeared normal.  No endometriosis was noted.  The patient was gently placed in Trendelenburg.  A small suprapubic incision was made and a 5-mm trocar was inserted under direct visualization.  The uterus was elevated.  The right tube was grasped with __________ grasper and the EnSeal was placed just beneath the mesosalpinx and it was carried down to the triple pedicle into the round on the right and on the left with excellent hemostasis.  We then released the pneumoperitoneum, went down to the vagina, placed a weighted speculum in the vagina, made a circumferential incision around the cervix and the posterior and anterior cul-de-sacs using  Metzenbaum scissors.  The curved Haney clamp staying snug beside the cervix and uterus, clamping the uterosacral, cardinal ligament complexes on each side.  Each pedicle was clamped, cut and suture ligated using 0 Vicryl suture.  We then made our way up to the top of the uterus carefully.  We then retroflexed the uterus and then removed the remainder of the pedicle on either side.  Each pedicle was secured using a suture ligature of 0 Vicryl suture.  We then placed angle stitches at the 3 o'clock and 9 o'clock position at the vaginal cuff using a figure-of-eight.  We then closed the posterior cuff imbricating peritoneum to the vaginal epithelium with a running stitch using 0 Vicryl suture and then we closed the cuff completely anterior to posterior using 0 Vicryl suture in a running locked stitch.  We then went back up to the abdomen, replaced the pneumoperitoneum using Nezhat irrigator, irrigated the pelvis.  The pelvis was very dry.  All pedicles looked good.  I then placed some Arista across the vaginal cough and then, we released the pneumoperitoneum and removed the trocars and then closed each incision with a stitch of 3-0 Vicryl, and then applied Dermabond to each incision.  All sponge, lap and instrument counts were correct x2.  The patient  was extubated and went to recovery room in stable condition.     [name] L. Helane Rima, M.D.     Nevin Bloodgood  D:  09/12/2017  T:  09/13/2017  Job:  [id number]

## 2018-03-12 ENCOUNTER — Other Ambulatory Visit: Payer: Self-pay | Admitting: Obstetrics and Gynecology

## 2018-03-12 DIAGNOSIS — R928 Other abnormal and inconclusive findings on diagnostic imaging of breast: Secondary | ICD-10-CM

## 2018-03-19 ENCOUNTER — Other Ambulatory Visit: Payer: Self-pay | Admitting: Obstetrics and Gynecology

## 2018-03-19 ENCOUNTER — Ambulatory Visit
Admission: RE | Admit: 2018-03-19 | Discharge: 2018-03-19 | Disposition: A | Discharge status: Home / Self Care | Payer: 59 | Source: Ambulatory Visit | Attending: Obstetrics and Gynecology | Admitting: Obstetrics and Gynecology

## 2018-03-19 DIAGNOSIS — N6489 Other specified disorders of breast: Secondary | ICD-10-CM

## 2018-03-19 DIAGNOSIS — R928 Other abnormal and inconclusive findings on diagnostic imaging of breast: Secondary | ICD-10-CM

## 2018-03-26 ENCOUNTER — Ambulatory Visit
Admission: RE | Admit: 2018-03-26 | Discharge: 2018-03-26 | Disposition: A | Discharge status: Home / Self Care | Payer: 59 | Source: Ambulatory Visit | Attending: Obstetrics and Gynecology | Admitting: Obstetrics and Gynecology

## 2018-03-26 DIAGNOSIS — N6489 Other specified disorders of breast: Secondary | ICD-10-CM

## 2018-03-29 HISTORY — PX: BREAST EXCISIONAL BIOPSY: SUR124

## 2018-04-10 ENCOUNTER — Other Ambulatory Visit: Payer: Self-pay | Admitting: General Surgery

## 2018-04-10 DIAGNOSIS — R928 Other abnormal and inconclusive findings on diagnostic imaging of breast: Secondary | ICD-10-CM

## 2018-04-11 ENCOUNTER — Other Ambulatory Visit: Payer: Self-pay | Admitting: General Surgery

## 2018-04-11 DIAGNOSIS — R928 Other abnormal and inconclusive findings on diagnostic imaging of breast: Secondary | ICD-10-CM

## 2018-04-17 ENCOUNTER — Encounter (HOSPITAL_BASED_OUTPATIENT_CLINIC_OR_DEPARTMENT_OTHER): Payer: Self-pay | Admitting: *Deleted

## 2018-04-17 ENCOUNTER — Other Ambulatory Visit: Payer: Self-pay

## 2018-04-22 NOTE — H&P (Signed)
Ashley Sawyer Location: [facility] Patient #: [id number] DOB: 08-26-1971 Married / Language: English / Race: White Female       History of Present Illness    . This is a pleasant 47 year old female, referred by Dr. Lovey Newcomer at the [facility] for an abnormal mammogram and discordant biopsy of the right breast upper outer quadrant. Ashley Sawyer is her gynecologist. Ashley Sawyer is her PCP. Ashley Sawyer served as my chaperone throughout the encounter      She has no prior history of breast problems. Recent screening mammogram showed a focal area of distortion in the right breast upper outer quadrant. Ultrasound the axilla was negative. Image guided biopsy showed fibrocystic changes and PAS H. This was felt to be discordant and excision was recommended by the radiologist.      Past history reveals vaginal hysterectomy in January 2019 for abnormal uterine bleeding. Benign. Mild anxiety and depression well controlled Family history reveals mother had breast cancer at age 51. Had COPD. Died of other causes. There is no family history of ovarian cancer or prostate cancer Social history reveals she is a former smoker. Occasional alcohol. Married. One daughter. 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      I had a long discussion with her. I told her that her risk of breast cancer was not that great. Maybe 5%. Clearly less than 10%. Her mother's history of breast cancer does weigh in on this. Her options are excision or active surveillance every 6 months. In the end she decided to have this area excised. I told her we would try to make an incision in the lateral breast as small as possible. She was concerned about cosmesis. She will be scheduled for right breast lumpectomy with radioactive seed localization. I discussed the indications, details, techniques, and numerous risk of the surgery with her. She is aware of the risk of bleeding, infection,  cosmetic deformity, reoperation of cancer, nerve damage with chronic pain. She understands all these issues. All her questions are answered. She agrees with this plan.   Past Surgical History  Breast Biopsy  Right. Colon Polyp Removal - Colonoscopy  Hysterectomy (not due to cancer) - Partial   Diagnostic Studies History  Mammogram  within last year Pap Smear  1-5 years ago  Allergies  No Known Drug Allergies Allergies Reconciled   Medication History Progesterone Micronized (100MG  Capsule, Oral) Active. ValACYclovir HCl (1GM Tablet, Oral) Active. Medications Reconciled  Social History Alcohol use  Occasional alcohol use. Caffeine use  Carbonated beverages, Tea. Illicit drug use  Remotely quit drug use. Tobacco use  Former smoker.  Family History  Alcohol Abuse  Brother. Breast Cancer  Mother. Colon Polyps  Mother, Sister. Diabetes Mellitus  Father. Heart disease in female family member before age 67  Respiratory Condition  Mother. Seizure disorder  Daughter.  Pregnancy / Birth History  Age at menarche  89 years. Contraceptive History  Oral contraceptives. Gravida  5 Irregular periods  Length (months) of breastfeeding  >24 Maternal age  47-20 Para  1  Other Problems  Gastroesophageal Reflux Disease  Hemorrhoids  Migraine Headache     Review of Systems  General Not Present- Appetite Loss, Chills, Fatigue, Fever, Night Sweats, Weight Gain and Weight Loss. Skin Not Present- Change in Wart/Mole, Dryness, Hives, Jaundice, New Lesions, Non-Healing Wounds, Rash and Ulcer. HEENT Present- Wears glasses/contact lenses. Not Present- Earache, Hearing Loss, Hoarseness, Nose Bleed, Oral Ulcers, Ringing in the Ears, Seasonal Allergies, Sinus Pain,  Sore Throat, Visual Disturbances and Yellow Eyes. Respiratory Not Present- Bloody sputum, Chronic Cough, Difficulty Breathing, Snoring and Wheezing. Breast Present- Breast Mass. Not Present- Breast  Pain, Nipple Discharge and Skin Changes. Cardiovascular Not Present- Chest Pain, Difficulty Breathing Lying Down, Leg Cramps, Palpitations, Rapid Heart Rate, Shortness of Breath and Swelling of Extremities. Gastrointestinal Not Present- Abdominal Pain, Bloating, Bloody Stool, Change in Bowel Habits, Chronic diarrhea, Constipation, Difficulty Swallowing, Excessive gas, Gets full quickly at meals, Hemorrhoids, Indigestion, Nausea, Rectal Pain and Vomiting. Female Genitourinary Not Present- Frequency, Nocturia, Painful Urination, Pelvic Pain and Urgency. Musculoskeletal Not Present- Back Pain, Joint Pain, Joint Stiffness, Muscle Pain, Muscle Weakness and Swelling of Extremities. Neurological Not Present- Decreased Memory, Fainting, Headaches, Numbness, Seizures, Tingling, Tremor, Trouble walking and Weakness. Psychiatric Not Present- Anxiety, Bipolar, Change in Sleep Pattern, Depression, Fearful and Frequent crying. Endocrine Not Present- Cold Intolerance, Excessive Hunger, Hair Changes, Heat Intolerance, Hot flashes and New Diabetes. Hematology Present- Easy Bruising. Not Present- Blood Thinners, Excessive bleeding, Gland problems, HIV and Persistent Infections.  Vitals  Weight: 158.2 lb Height: 64in Body Surface Area: 1.77 m Body Mass Index: 27.15 kg/m  Temp.: 98.87F  Pulse: 74 (Regular)  BP: 118/74 (Sitting, Left Arm, Standard)     Physical Exam  General Mental Status-Alert. General Appearance-Consistent with stated age. Hydration-Well hydrated. Voice-Normal.  Head and Neck Head-normocephalic, atraumatic with no lesions or palpable masses. Trachea-midline. Thyroid Gland Characteristics - normal size and consistency.  Eye Eyeball - Bilateral-Extraocular movements intact. Sclera/Conjunctiva - Bilateral-No scleral icterus.  Chest and Lung Exam Chest and lung exam reveals -quiet, even and easy respiratory effort with no use of accessory muscles and on  auscultation, normal breath sounds, no adventitious sounds and normal vocal resonance. Inspection Chest Wall - Normal. Back - normal.  Breast Note: Breasts are medium to large in size. Biopsy site on right lateral breast noted. No hematoma. No mass in either breast. No other skin changes. No axillary adenopathy.   Cardiovascular Cardiovascular examination reveals -normal heart sounds, regular rate and rhythm with no murmurs and normal pedal pulses bilaterally.  Abdomen Inspection Inspection of the abdomen reveals - No Hernias. Skin - Scar - no surgical scars. Palpation/Percussion Palpation and Percussion of the abdomen reveal - Soft, Non Tender, No Rebound tenderness, No Rigidity (guarding) and No hepatosplenomegaly. Auscultation Auscultation of the abdomen reveals - Bowel sounds normal.  Neurologic Neurologic evaluation reveals -alert and oriented x 3 with no impairment of recent or remote memory. Mental Status-Normal.  Musculoskeletal Normal Exam - Left-Upper Extremity Strength Normal and Lower Extremity Strength Normal. Normal Exam - Right-Upper Extremity Strength Normal and Lower Extremity Strength Normal.  Lymphatic Head & Neck  General Head & Neck Lymphatics: Bilateral - Description - Normal. Axillary  General Axillary Region: Bilateral - Description - Normal. Tenderness - Non Tender. Femoral & Inguinal  Generalized Femoral & Inguinal Lymphatics: Bilateral - Description - Normal. Tenderness - Non Tender.     Assessment & Plan ABNORMAL MAMMOGRAM OF RIGHT BREAST (R92.8)    Your recent mammograms and ultrasound show a focal area of distortion in the right breast, upper outer quadrant Biopsy shows benign changes and hyperplasia The radiologist is concerned that this does not correlate and that you need this area excised to prove that you do not have cancer Given the fact that your mother had breast cancer, your risk of having breast cancer is probably  somewhere between 5 and 10%. Most likely you do not have cancer  After lengthy discussion you decided to go  ahead with a right breast lumpectomy with radioactive seed localization We have discussed the indications, techniques, and numerous risk of the surgery in detail  FORMER SMOKER [phone]) [place] ([id number])    Edsel Petrin. Dalbert Batman, M.D., [facility] General and Minimally invasive Surgery Breast and Colorectal Surgery Office:   [phone] Pager:   [phone]

## 2018-04-24 ENCOUNTER — Ambulatory Visit
Admission: RE | Admit: 2018-04-24 | Discharge: 2018-04-24 | Disposition: A | Discharge status: Home / Self Care | Payer: 59 | Source: Ambulatory Visit | Attending: General Surgery | Admitting: General Surgery

## 2018-04-24 DIAGNOSIS — R928 Other abnormal and inconclusive findings on diagnostic imaging of breast: Secondary | ICD-10-CM

## 2018-04-24 NOTE — Progress Notes (Signed)
Ensure pre surgery drink given with instructions to complete by 0800 dos, surgical soap given with instructions, pt verbalized understanding. 

## 2018-04-25 ENCOUNTER — Other Ambulatory Visit: Payer: Self-pay

## 2018-04-25 ENCOUNTER — Encounter (HOSPITAL_BASED_OUTPATIENT_CLINIC_OR_DEPARTMENT_OTHER)
Admission: RE | Disposition: A | Discharge status: Home / Self Care | Payer: Self-pay | Source: Ambulatory Visit | Attending: General Surgery

## 2018-04-25 ENCOUNTER — Ambulatory Visit (HOSPITAL_BASED_OUTPATIENT_CLINIC_OR_DEPARTMENT_OTHER)
Admission: RE | Admit: 2018-04-25 | Discharge: 2018-04-25 | Disposition: A | Discharge status: Home / Self Care | Payer: 59 | Source: Ambulatory Visit | Attending: General Surgery | Admitting: General Surgery

## 2018-04-25 ENCOUNTER — Encounter (HOSPITAL_BASED_OUTPATIENT_CLINIC_OR_DEPARTMENT_OTHER): Payer: Self-pay | Admitting: Anesthesiology

## 2018-04-25 ENCOUNTER — Ambulatory Visit (HOSPITAL_BASED_OUTPATIENT_CLINIC_OR_DEPARTMENT_OTHER): Payer: 59 | Admitting: Anesthesiology

## 2018-04-25 ENCOUNTER — Ambulatory Visit
Admission: RE | Admit: 2018-04-25 | Discharge: 2018-04-25 | Disposition: A | Discharge status: Home / Self Care | Payer: 59 | Source: Ambulatory Visit | Attending: General Surgery | Admitting: General Surgery

## 2018-04-25 DIAGNOSIS — Z79890 Hormone replacement therapy (postmenopausal): Secondary | ICD-10-CM | POA: Insufficient documentation

## 2018-04-25 DIAGNOSIS — F419 Anxiety disorder, unspecified: Secondary | ICD-10-CM | POA: Insufficient documentation

## 2018-04-25 DIAGNOSIS — Z803 Family history of malignant neoplasm of breast: Secondary | ICD-10-CM | POA: Insufficient documentation

## 2018-04-25 DIAGNOSIS — N6011 Diffuse cystic mastopathy of right breast: Secondary | ICD-10-CM | POA: Diagnosis not present

## 2018-04-25 DIAGNOSIS — Z8371 Family history of colonic polyps: Secondary | ICD-10-CM | POA: Diagnosis not present

## 2018-04-25 DIAGNOSIS — R928 Other abnormal and inconclusive findings on diagnostic imaging of breast: Secondary | ICD-10-CM | POA: Diagnosis present

## 2018-04-25 DIAGNOSIS — N6081 Other benign mammary dysplasias of right breast: Secondary | ICD-10-CM | POA: Insufficient documentation

## 2018-04-25 DIAGNOSIS — Z79899 Other long term (current) drug therapy: Secondary | ICD-10-CM | POA: Diagnosis not present

## 2018-04-25 DIAGNOSIS — Z87891 Personal history of nicotine dependence: Secondary | ICD-10-CM | POA: Insufficient documentation

## 2018-04-25 HISTORY — DX: Other abnormal and inconclusive findings on diagnostic imaging of breast: R92.8

## 2018-04-25 HISTORY — PX: BREAST LUMPECTOMY WITH RADIOACTIVE SEED LOCALIZATION: SHX6424

## 2018-04-25 SURGERY — BREAST LUMPECTOMY WITH RADIOACTIVE SEED LOCALIZATION
Anesthesia: General | Site: Breast | Laterality: Right

## 2018-04-25 MED ORDER — MIDAZOLAM HCL 2 MG/2ML IJ SOLN
1.0000 mg | INTRAMUSCULAR | Status: DC | PRN
  Administered 2018-04-25: 2 mg via INTRAVENOUS

## 2018-04-25 MED ORDER — ACETAMINOPHEN 500 MG PO TABS
ORAL_TABLET | ORAL | Status: AC
  Filled 2018-04-25: qty 2

## 2018-04-25 MED ORDER — DEXAMETHASONE SODIUM PHOSPHATE 10 MG/ML IJ SOLN
INTRAMUSCULAR | Status: AC
  Filled 2018-04-25: qty 1

## 2018-04-25 MED ORDER — ONDANSETRON HCL 4 MG/2ML IJ SOLN
INTRAMUSCULAR | Status: DC | PRN
  Administered 2018-04-25: 4 mg via INTRAVENOUS

## 2018-04-25 MED ORDER — CHLORHEXIDINE GLUCONATE CLOTH 2 % EX PADS: 6.0000 | MEDICATED_PAD | Freq: Once | CUTANEOUS | Status: DC

## 2018-04-25 MED ORDER — CEFAZOLIN SODIUM-DEXTROSE 2-4 GM/100ML-% IV SOLN
INTRAVENOUS | Status: AC
  Filled 2018-04-25: qty 100

## 2018-04-25 MED ORDER — GABAPENTIN 300 MG PO CAPS
300.0000 mg | ORAL_CAPSULE | ORAL | Status: AC
  Administered 2018-04-25: 300 mg via ORAL

## 2018-04-25 MED ORDER — FENTANYL CITRATE (PF) 100 MCG/2ML IJ SOLN
INTRAMUSCULAR | Status: AC
  Filled 2018-04-25: qty 2

## 2018-04-25 MED ORDER — LIDOCAINE HCL (CARDIAC) PF 100 MG/5ML IV SOSY
PREFILLED_SYRINGE | INTRAVENOUS | Status: DC | PRN
  Administered 2018-04-25: 80 mg via INTRAVENOUS

## 2018-04-25 MED ORDER — CELECOXIB 200 MG PO CAPS
ORAL_CAPSULE | ORAL | Status: AC
  Filled 2018-04-25: qty 1

## 2018-04-25 MED ORDER — TRAMADOL HCL 50 MG PO TABS: 50.0000 mg | ORAL_TABLET | Freq: Four times a day (QID) | ORAL | 0 refills | Status: AC | PRN

## 2018-04-25 MED ORDER — PROPOFOL 10 MG/ML IV BOLUS
INTRAVENOUS | Status: DC | PRN
  Administered 2018-04-25: 150 mg via INTRAVENOUS

## 2018-04-25 MED ORDER — METOCLOPRAMIDE HCL 5 MG/ML IJ SOLN: 10.0000 mg | Freq: Once | INTRAMUSCULAR | Status: DC | PRN

## 2018-04-25 MED ORDER — FENTANYL CITRATE (PF) 100 MCG/2ML IJ SOLN
50.0000 ug | INTRAMUSCULAR | Status: DC | PRN
  Administered 2018-04-25: 50 ug via INTRAVENOUS

## 2018-04-25 MED ORDER — LACTATED RINGERS IV SOLN: INTRAVENOUS | Status: DC

## 2018-04-25 MED ORDER — FENTANYL CITRATE (PF) 100 MCG/2ML IJ SOLN: 25.0000 ug | INTRAMUSCULAR | Status: DC | PRN

## 2018-04-25 MED ORDER — CELECOXIB 200 MG PO CAPS
200.0000 mg | ORAL_CAPSULE | ORAL | Status: AC
  Administered 2018-04-25: 200 mg via ORAL

## 2018-04-25 MED ORDER — GABAPENTIN 300 MG PO CAPS
ORAL_CAPSULE | ORAL | Status: AC
  Filled 2018-04-25: qty 1

## 2018-04-25 MED ORDER — ONDANSETRON HCL 4 MG/2ML IJ SOLN
INTRAMUSCULAR | Status: AC
  Filled 2018-04-25: qty 2

## 2018-04-25 MED ORDER — LACTATED RINGERS IV SOLN
INTRAVENOUS | Status: DC
  Administered 2018-04-25: 11:00:00 via INTRAVENOUS

## 2018-04-25 MED ORDER — DEXAMETHASONE SODIUM PHOSPHATE 4 MG/ML IJ SOLN
INTRAMUSCULAR | Status: DC | PRN
  Administered 2018-04-25: 10 mg via INTRAVENOUS

## 2018-04-25 MED ORDER — MIDAZOLAM HCL 2 MG/2ML IJ SOLN
INTRAMUSCULAR | Status: AC
  Filled 2018-04-25: qty 2

## 2018-04-25 MED ORDER — BUPIVACAINE-EPINEPHRINE (PF) 0.5% -1:200000 IJ SOLN
INTRAMUSCULAR | Status: DC | PRN
  Administered 2018-04-25: 10 mL

## 2018-04-25 MED ORDER — SCOPOLAMINE 1 MG/3DAYS TD PT72: 1.0000 | MEDICATED_PATCH | Freq: Once | TRANSDERMAL | Status: DC | PRN

## 2018-04-25 MED ORDER — ACETAMINOPHEN 500 MG PO TABS
1000.0000 mg | ORAL_TABLET | ORAL | Status: AC
  Administered 2018-04-25: 1000 mg via ORAL

## 2018-04-25 MED ORDER — CEFAZOLIN SODIUM-DEXTROSE 2-4 GM/100ML-% IV SOLN
2.0000 g | INTRAVENOUS | Status: AC
  Administered 2018-04-25: 2 g via INTRAVENOUS

## 2018-04-25 MED ORDER — MEPERIDINE HCL 25 MG/ML IJ SOLN: 6.2500 mg | INTRAMUSCULAR | Status: DC | PRN

## 2018-04-25 SURGICAL SUPPLY — 64 items
ADH SKN CLS APL DERMABOND .7 (GAUZE/BANDAGES/DRESSINGS) ×1
APL SKNCLS STERI-STRIP NONHPOA (GAUZE/BANDAGES/DRESSINGS)
APPLIER CLIP 9.375 MED OPEN (MISCELLANEOUS)
APR CLP MED 9.3 20 MLT OPN (MISCELLANEOUS)
BENZOIN TINCTURE PRP APPL 2/3 (GAUZE/BANDAGES/DRESSINGS) IMPLANT
BINDER BREAST LRG (GAUZE/BANDAGES/DRESSINGS) ×2 IMPLANT
BINDER BREAST MEDIUM (GAUZE/BANDAGES/DRESSINGS) IMPLANT
BINDER BREAST XLRG (GAUZE/BANDAGES/DRESSINGS) IMPLANT
BINDER BREAST XXLRG (GAUZE/BANDAGES/DRESSINGS) IMPLANT
BLADE HEX COATED 2.75 (ELECTRODE) ×3 IMPLANT
BLADE SURG 10 STRL SS (BLADE) IMPLANT
BLADE SURG 15 STRL LF DISP TIS (BLADE) ×1 IMPLANT
BLADE SURG 15 STRL SS (BLADE) ×3
CANISTER SUC SOCK COL 7IN (MISCELLANEOUS) IMPLANT
CANISTER SUCT 1200ML W/VALVE (MISCELLANEOUS) ×3 IMPLANT
CHLORAPREP W/TINT 26ML (MISCELLANEOUS) ×3 IMPLANT
CLIP APPLIE 9.375 MED OPEN (MISCELLANEOUS) IMPLANT
CLOSURE WOUND 1/2 X4 (GAUZE/BANDAGES/DRESSINGS)
COVER BACK TABLE 60X90IN (DRAPES) ×3 IMPLANT
COVER MAYO STAND STRL (DRAPES) ×3 IMPLANT
COVER PROBE W GEL 5X96 (DRAPES) ×3 IMPLANT
DECANTER SPIKE VIAL GLASS SM (MISCELLANEOUS) IMPLANT
DERMABOND ADVANCED (GAUZE/BANDAGES/DRESSINGS) ×2
DERMABOND ADVANCED .7 DNX12 (GAUZE/BANDAGES/DRESSINGS) ×1 IMPLANT
DEVICE DUBIN W/COMP PLATE 8390 (MISCELLANEOUS) ×3 IMPLANT
DRAPE LAPAROSCOPIC ABDOMINAL (DRAPES) ×3 IMPLANT
DRAPE UTILITY XL STRL (DRAPES) ×3 IMPLANT
DRSG PAD ABDOMINAL 8X10 ST (GAUZE/BANDAGES/DRESSINGS) IMPLANT
ELECT REM PT RETURN 9FT ADLT (ELECTROSURGICAL) ×3
ELECTRODE REM PT RTRN 9FT ADLT (ELECTROSURGICAL) ×1 IMPLANT
GAUZE SPONGE 4X4 12PLY STRL LF (GAUZE/BANDAGES/DRESSINGS) ×4 IMPLANT
GLOVE EUDERMIC 7 POWDERFREE (GLOVE) ×3 IMPLANT
GLOVE SURG SS PI 7.0 STRL IVOR (GLOVE) ×4 IMPLANT
GOWN STRL REUS W/ TWL LRG LVL3 (GOWN DISPOSABLE) ×1 IMPLANT
GOWN STRL REUS W/ TWL XL LVL3 (GOWN DISPOSABLE) ×1 IMPLANT
GOWN STRL REUS W/TWL LRG LVL3 (GOWN DISPOSABLE) ×3
GOWN STRL REUS W/TWL XL LVL3 (GOWN DISPOSABLE) ×3
ILLUMINATOR WAVEGUIDE N/F (MISCELLANEOUS) IMPLANT
KIT MARKER MARGIN INK (KITS) ×3 IMPLANT
LIGHT WAVEGUIDE WIDE FLAT (MISCELLANEOUS) IMPLANT
NDL HYPO 25X1 1.5 SAFETY (NEEDLE) ×1 IMPLANT
NEEDLE HYPO 25X1 1.5 SAFETY (NEEDLE) ×3 IMPLANT
NS IRRIG 1000ML POUR BTL (IV SOLUTION) ×3 IMPLANT
PACK BASIN DAY SURGERY FS (CUSTOM PROCEDURE TRAY) ×3 IMPLANT
PENCIL BUTTON HOLSTER BLD 10FT (ELECTRODE) ×3 IMPLANT
SHEET MEDIUM DRAPE 40X70 STRL (DRAPES) IMPLANT
SLEEVE SCD COMPRESS KNEE MED (MISCELLANEOUS) ×3 IMPLANT
SPONGE LAP 18X18 RF (DISPOSABLE) IMPLANT
SPONGE LAP 4X18 RFD (DISPOSABLE) ×3 IMPLANT
STRIP CLOSURE SKIN 1/2X4 (GAUZE/BANDAGES/DRESSINGS) IMPLANT
SUT ETHILON 3 0 FSL (SUTURE) IMPLANT
SUT MNCRL AB 4-0 PS2 18 (SUTURE) ×3 IMPLANT
SUT SILK 2 0 SH (SUTURE) ×3 IMPLANT
SUT VIC AB 2-0 CT1 27 (SUTURE)
SUT VIC AB 2-0 CT1 TAPERPNT 27 (SUTURE) IMPLANT
SUT VIC AB 3-0 SH 27 (SUTURE)
SUT VIC AB 3-0 SH 27X BRD (SUTURE) IMPLANT
SUT VICRYL 3-0 CR8 SH (SUTURE) ×3 IMPLANT
SYR 10ML LL (SYRINGE) ×3 IMPLANT
TOWEL GREEN STERILE FF (TOWEL DISPOSABLE) ×3 IMPLANT
TOWEL OR NON WOVEN STRL DISP B (DISPOSABLE) IMPLANT
TUBE CONNECTING 20'X1/4 (TUBING) ×1
TUBE CONNECTING 20X1/4 (TUBING) ×2 IMPLANT
YANKAUER SUCT BULB TIP NO VENT (SUCTIONS) ×3 IMPLANT

## 2018-04-25 NOTE — Anesthesia Preprocedure Evaluation (Signed)
Anesthesia Evaluation  Patient identified by MRN, date of birth, ID band Patient awake    Reviewed: Allergy & Precautions, NPO status , Patient's Chart, lab work & pertinent test results  Airway Mallampati: II  TM Distance: >3 FB Neck ROM: Full    Dental no notable dental hx.    Pulmonary former smoker,    Pulmonary exam normal breath sounds clear to auscultation       Cardiovascular Exercise Tolerance: Good negative cardio ROS Normal cardiovascular exam Rhythm:Regular Rate:Normal     Neuro/Psych negative neurological ROS  negative psych ROS   GI/Hepatic Neg liver ROS, GERD  Controlled,  Endo/Other  negative endocrine ROS  Renal/GU negative Renal ROS  negative genitourinary   Musculoskeletal negative musculoskeletal ROS (+)   Abdominal   Peds negative pediatric ROS (+)  Hematology negative hematology ROS (+)   Anesthesia Other Findings   Reproductive/Obstetrics negative OB ROS                             Anesthesia Physical Anesthesia Plan  ASA: II  Anesthesia Plan: General   Post-op Pain Management:    Induction: Intravenous  PONV Risk Score and Plan: 3 and Ondansetron, Dexamethasone, Midazolam and Treatment may vary due to age or medical condition  Airway Management Planned: LMA  Additional Equipment:   Intra-op Plan:   Post-operative Plan: Extubation in OR  Informed Consent: I have reviewed the patients History and Physical, chart, labs and discussed the procedure including the risks, benefits and alternatives for the proposed anesthesia with the patient or authorized representative who has indicated his/her understanding and acceptance.   Dental advisory given  Plan Discussed with: CRNA  Anesthesia Plan Comments:         Anesthesia Quick Evaluation

## 2018-04-25 NOTE — Anesthesia Procedure Notes (Signed)
Procedure Name: LMA Insertion Date/Time: 04/25/2018 11:28 AM Performed by: Maryella Shivers, CRNA Pre-anesthesia Checklist: Patient identified, Emergency Drugs available, Suction available and Patient being monitored Patient Re-evaluated:Patient Re-evaluated prior to induction Oxygen Delivery Method: Circle system utilized Preoxygenation: Pre-oxygenation with 100% oxygen Induction Type: IV induction Ventilation: Mask ventilation without difficulty LMA: LMA inserted LMA Size: 4.0 Number of attempts: 1 Airway Equipment and Method: Bite block Placement Confirmation: positive ETCO2 Tube secured with: Tape Dental Injury: Teeth and Oropharynx as per pre-operative assessment

## 2018-04-25 NOTE — Interval H&P Note (Signed)
History and Physical Interval Note:  04/25/2018 10:30 AM  Ashley Sawyer  has presented today for surgery, with the diagnosis of abnormal mammogram right breast  The various methods of treatment have been discussed with the patient and family. After consideration of risks, benefits and other options for treatment, the patient has consented to  Procedure(s): RIGHT BREAST LUMPECTOMY WITH RADIOACTIVE SEED LOCALIZATION ERAS PATHWAY (Right) as a surgical intervention .  The patient's history has been reviewed, patient examined, no change in status, stable for surgery.  I have reviewed the patient's chart and labs.  Questions were answered to the patient's satisfaction.     Adin Hector

## 2018-04-25 NOTE — Transfer of Care (Signed)
Immediate Anesthesia Transfer of Care Note  Patient: Ashley Sawyer  Procedure(s) Performed: RIGHT BREAST LUMPECTOMY WITH RADIOACTIVE SEED LOCALIZATION ERAS PATHWAY (Right Breast)  Patient Location: PACU  Anesthesia Type:General  Level of Consciousness: sedated  Airway & Oxygen Therapy: Patient Spontanous Breathing and Patient connected to face mask oxygen  Post-op Assessment: Report given to RN and Post -op Vital signs reviewed and stable  Post vital signs: Reviewed and stable  Last Vitals:  Vitals Value Taken Time  BP 96/54 04/25/2018 12:15 PM  Temp    Pulse 84 04/25/2018 12:17 PM  Resp 17 04/25/2018 12:17 PM  SpO2 99 % 04/25/2018 12:17 PM  Vitals shown include unvalidated device data.  Last Pain:  Vitals:   04/25/18 1014  TempSrc: Oral  PainSc: 0-No pain         Complications: No apparent anesthesia complications

## 2018-04-25 NOTE — Anesthesia Postprocedure Evaluation (Signed)
Anesthesia Post Note  Patient: Ashley Sawyer  Procedure(s) Performed: RIGHT BREAST LUMPECTOMY WITH RADIOACTIVE SEED LOCALIZATION ERAS PATHWAY (Right Breast)     Patient location during evaluation: PACU Anesthesia Type: General Level of consciousness: awake and alert, awake and oriented Pain management: pain level controlled Vital Signs Assessment: post-procedure vital signs reviewed and stable Respiratory status: spontaneous breathing, nonlabored ventilation and respiratory function stable Cardiovascular status: blood pressure returned to baseline and stable Postop Assessment: no apparent nausea or vomiting Anesthetic complications: no    Last Vitals:  Vitals:   04/25/18 1245 04/25/18 1309  BP: 103/68 117/73  Pulse: 69 65  Resp: 14 18  Temp:  36.7 C  SpO2: 99% 100%    Last Pain:  Vitals:   04/25/18 1309  TempSrc:   PainSc: 0-No pain                 Catalina Gravel

## 2018-04-25 NOTE — Discharge Instructions (Signed)
[facility] °Office Phone Number [phone] ° °BREAST BIOPSY/ PARTIAL MASTECTOMY: POST OP INSTRUCTIONS ° °Always review your discharge instruction sheet given to you by the facility where your surgery was performed. ° °IF YOU HAVE DISABILITY OR FAMILY LEAVE FORMS, YOU MUST BRING THEM TO THE OFFICE FOR PROCESSING.  DO NOT GIVE THEM TO YOUR DOCTOR. ° °1. A prescription for pain medication may be given to you upon discharge.  Take your pain medication as prescribed, if needed.  If narcotic pain medicine is not needed, then you may take acetaminophen (Tylenol) or ibuprofen (Advil) as needed. °2. Take your usually prescribed medications unless otherwise directed °3. If you need a refill on your pain medication, please contact your pharmacy.  They will contact our office to request authorization.  Prescriptions will not be filled after 5pm or on week-ends. °4. You should eat very light the first 24 hours after surgery, such as soup, crackers, pudding, etc.  Resume your normal diet the day after surgery. °5. Most patients will experience some swelling and bruising in the breast.  Ice packs and a good support bra will help.  Swelling and bruising can take several days to resolve.  °6. It is common to experience some constipation if taking pain medication after surgery.  Increasing fluid intake and taking a stool softener will usually help or prevent this problem from occurring.  A mild laxative (Milk of Magnesia or Miralax) should be taken according to package directions if there are no bowel movements after 48 hours. °7. Unless discharge instructions indicate otherwise, you may remove your bandages 24-48 hours after surgery, and you may shower at that time.  You may have steri-strips (small skin tapes) in place directly over the incision.  These strips should be left on the skin for 7-10 days.  If your surgeon used skin glue on the incision, you may shower in 24 hours.  The glue will flake off over the  next 2-3 weeks.  Any sutures or staples will be removed at the office during your follow-up visit. °8. ACTIVITIES:  You may resume regular daily activities (gradually increasing) beginning the next day.  Wearing a good support bra or sports bra minimizes pain and swelling.  You may have sexual intercourse when it is comfortable. °a. You may drive when you no longer are taking prescription pain medication, you can comfortably wear a seatbelt, and you can safely maneuver your car and apply brakes. °b. RETURN TO WORK:  ______________________________________________________________________________________ °9. You should see your doctor in the office for a follow-up appointment approximately two weeks after your surgery.  Your doctor’s nurse will typically make your follow-up appointment when she calls you with your pathology report.  Expect your pathology report 2-3 business days after your surgery.  You may call to check if you do not hear from us after three days. °10. OTHER INSTRUCTIONS: _______________________________________________________________________________________________ _____________________________________________________________________________________________________________________________________ °_____________________________________________________________________________________________________________________________________ °_____________________________________________________________________________________________________________________________________ ° °WHEN TO CALL YOUR DOCTOR: °1. Fever over 101.0 °2. Nausea and/or vomiting. °3. Extreme swelling or bruising. °4. Continued bleeding from incision. °5. Increased pain, redness, or drainage from the incision. ° °The clinic staff is available to answer your questions during regular business hours.  Please don’t hesitate to call and ask to speak to one of the nurses for clinical concerns.  If you have a medical emergency, go to the nearest  emergency room or call 911.  A surgeon from [facility] is always on call at the hospital. ° °For further questions, please visit [email or web address]  ° ° ° ° °  Post Anesthesia Home Care Instructions ° °Activity: °Get plenty of rest for the remainder of the day. A responsible individual must stay with you for 24 hours following the procedure.  °For the next 24 hours, DO NOT: °-Drive a car °-Operate machinery °-Drink alcoholic beverages °-Take any medication unless instructed by your physician °-Make any legal decisions or sign important papers. ° °Meals: °Start with liquid foods such as gelatin or soup. Progress to regular foods as tolerated. Avoid greasy, spicy, heavy foods. If nausea and/or vomiting occur, drink only clear liquids until the nausea and/or vomiting subsides. Call your physician if vomiting continues. ° °Special Instructions/Symptoms: °Your throat may feel dry or sore from the anesthesia or the breathing tube placed in your throat during surgery. If this causes discomfort, gargle with warm salt water. The discomfort should disappear within 24 hours. ° °If you had a scopolamine patch placed behind your ear for the management of post- operative nausea and/or vomiting: ° °1. The medication in the patch is effective for 72 hours, after which it should be removed.  Wrap patch in a tissue and discard in the trash. Wash hands thoroughly with soap and water. °2. You may remove the patch earlier than 72 hours if you experience unpleasant side effects which may include dry mouth, dizziness or visual disturbances. °3. Avoid touching the patch. Wash your hands with soap and water after contact with the patch. °  ° °

## 2018-04-25 NOTE — Op Note (Signed)
Patient Name:           Ashley Sawyer   Date of Surgery:        04/25/2018  Pre op Diagnosis:      Abnormal mammogram right breast; discordant image guided biopsy  Post op Diagnosis:    Same  Procedure:                 Right breast lumpectomy with radioactive seed localization and margin assessment  Surgeon:                     Haywood M. Ingram, M.D., FACS  Assistant:                      OR staff  Operative Indications:    This is a pleasant 46-year-old female, referred by Dr. Drew Sawyer at the [facility] for an abnormal mammogram and discordant biopsy of the right breast upper outer quadrant. Ashley Sawyer is her gynecologist. Ashley Sawyer is her PCP. Ashley Sawyer served as my chaperone throughout the encounter      She has no prior history of breast problems. Recent screening mammogram showed a focal area of distortion in the right breast upper outer quadrant. Ultrasound the axilla was negative. 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 This was felt to be discordant and excision was recommended by the radiologist. Family history reveals mother had breast cancer at age 70.  There is no family history of ovarian cancer or prostate cancer      I had a long discussion with her. I told her that her risk of breast cancer was not that great. Maybe 5%. Clearly less than 10%. Her mother's history of breast cancer does weigh in on this. Her options are excision or active surveillance every 6 months. In the end she decided to have this area excised. I told her we would try to make an incision in the lateral breast as small as possible. She was concerned about cosmesis. She will be scheduled for right breast lumpectomy with radioactive seed localization.   Operative Findings:       The radioactive seed and area of distortion were in the lateral right breast at about the 10 o'clock position.  It was relatively superficial so I did not have to take a very large volume of tissue.   The specimen mammogram looked good containing the seed and the marker clip in the center of the specimen.  There was no palpable abnormality.  Procedure in Detail:        Following the induction of general LMA anesthesia the patient's right breast was prepped and draped in a sterile fashion.  Surgical timeout was performed.  Intravenous antibiotics were given.  0.5% Marcaine with epinephrine was used as a local infiltration anesthetic.      Using the neoprobe I identified the area of the radioactive seed.  This was significantly lateral in the right breast but relatively superficial.  A curvilinear circumareolar type incision was made in the far lateral right breast at about the 10 o'clock position.  Lumpectomy was performed using the neoprobe and electrocautery.  The specimen was removed and marked with silk sutures and a 6 color ink kit to orient the pathologist.  Specimen mammogram looked good as described above.  The specimen was sent to the lab where the seed was retrieved.  Hemostasis was excellent.  The wound was irrigated.  5 metal marker clips were placed in the   walls of the lumpectomy.  Lumpectomy cavity was closed with interrupted sutures of 3-0 Vicryl and the skin closed with a running subcuticular 4-0 Monocryl and Dermabond.  Patient tolerated the procedure well and was taken to PACU in stable condition.  EBL 10 cc.  Counts correct.  Complications none    Addendum: I logged onto the [facility] website and reviewed her prescription medication history      Haywood M. Ingram, M.D., FACS General and Minimally Invasive Surgery Breast and Colorectal Surgery  04/25/2018 12:09 PM  

## 2018-04-26 ENCOUNTER — Encounter (HOSPITAL_BASED_OUTPATIENT_CLINIC_OR_DEPARTMENT_OTHER): Payer: Self-pay | Admitting: General Surgery

## 2018-05-01 NOTE — Progress Notes (Signed)
Inform patient of Pathology report,. Breast pathology benign. Will discuss in detail at next OV. Let me know that patient is aware.  Ashley Sawyer

## 2018-11-22 IMAGING — MG MM PLC BREAST LOC DEV 1ST LESION INC*R*
8 of 11 series · 8 of 23 positions shown · non-contrast
Comparison: Previous exam(s).

ADDENDUM:
The order number of the N-8NB seed is [id number]. The total activity
is 0.252 mCi. The reference date is 04/13/2018.
CLINICAL DATA: Biopsied with distortion in the outer right breast
3D stereotactic guidance recently, with benign results felt be
discordant with the imaging findings. Excision was recommended. Pre
excision radioactive seed localization.

EXAM:
MAMMOGRAPHIC GUIDED RADIOACTIVE SEED LOCALIZATION OF THE RIGHT
BREAST

[R CC (1 of 2)]
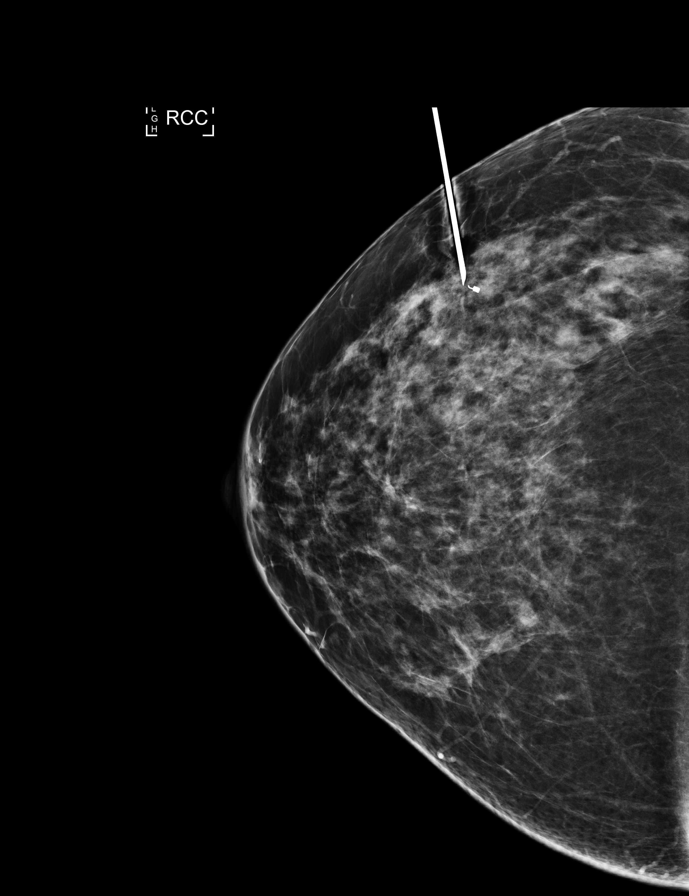

[R LM (1 of 3)]
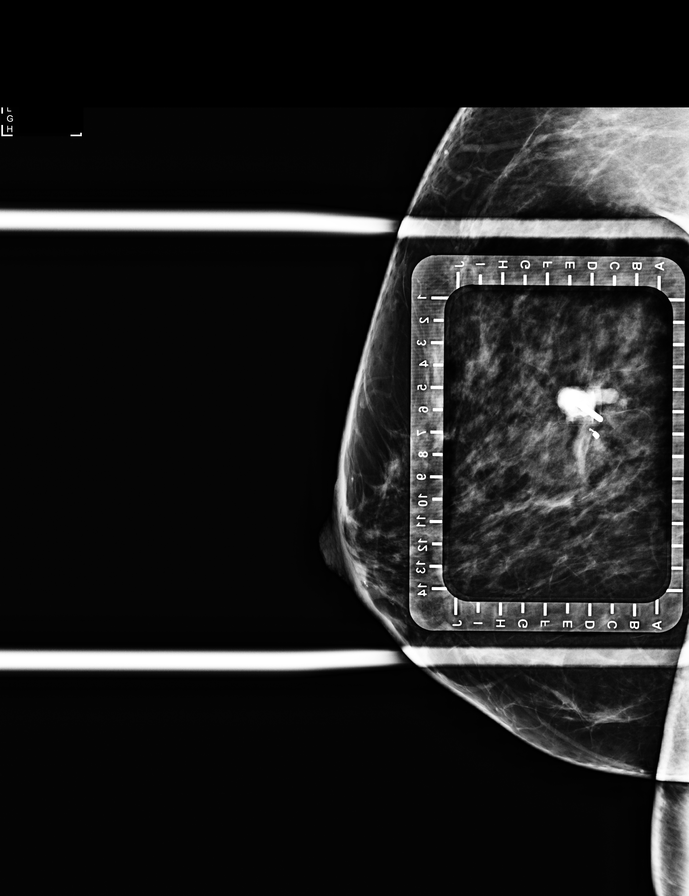

[R CC (2 of 2)]
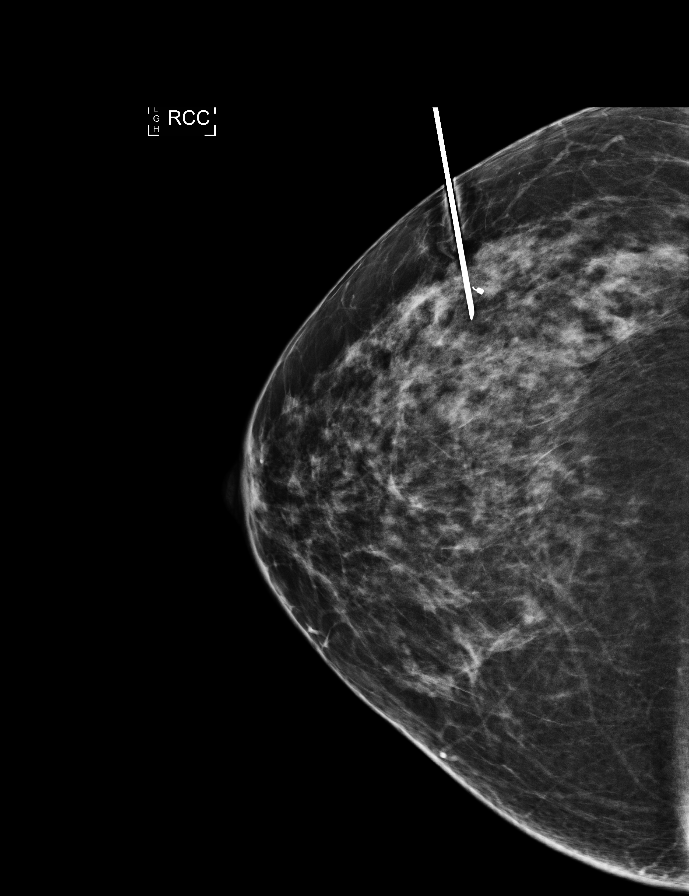

[R LM (2 of 3)]
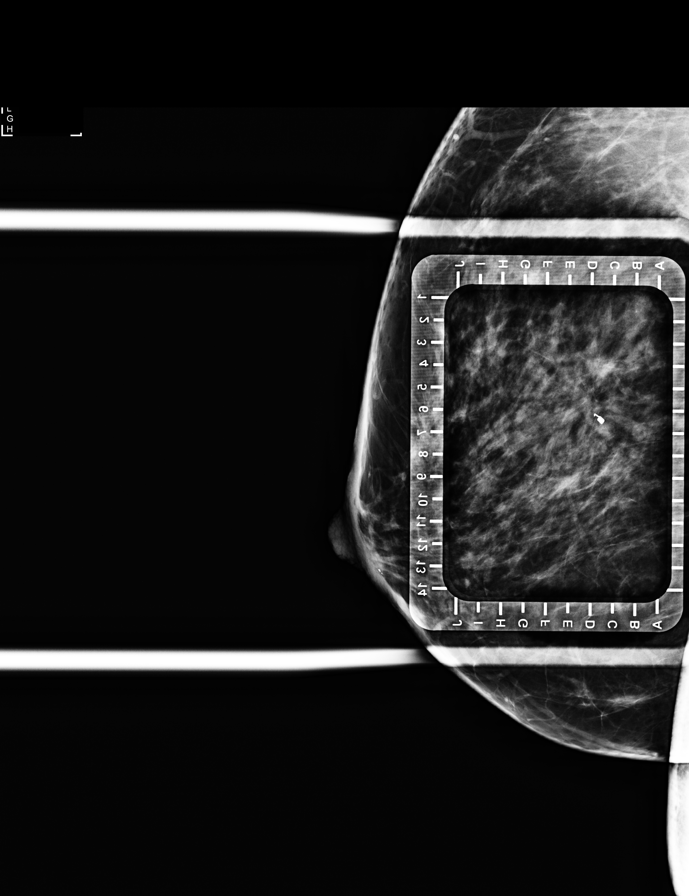

[R LM (3 of 3)]
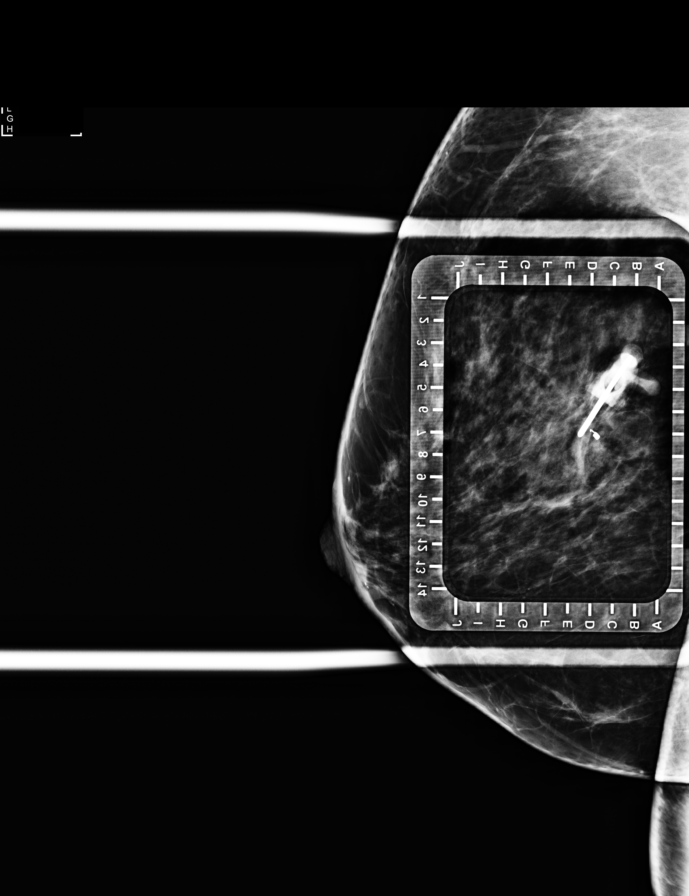

[R LM synth-2D (1 of 2)]
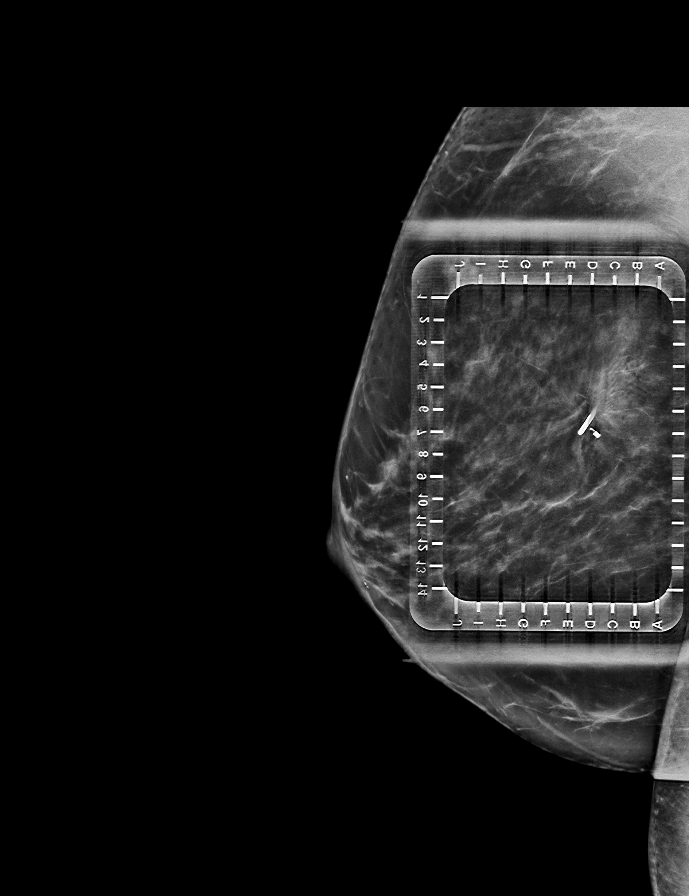

[R CC synth-2D]
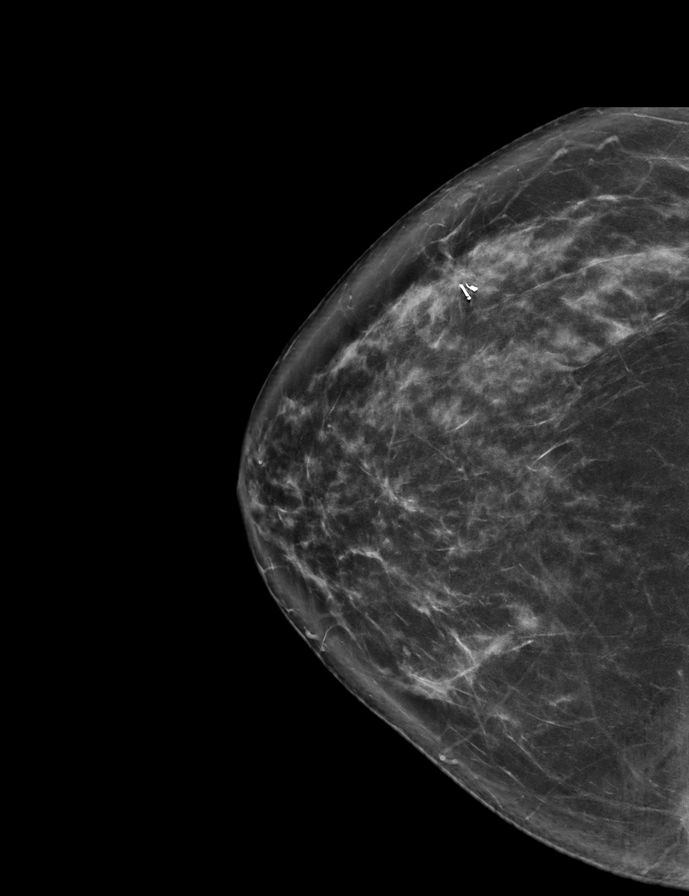

[R LM synth-2D (2 of 2)]
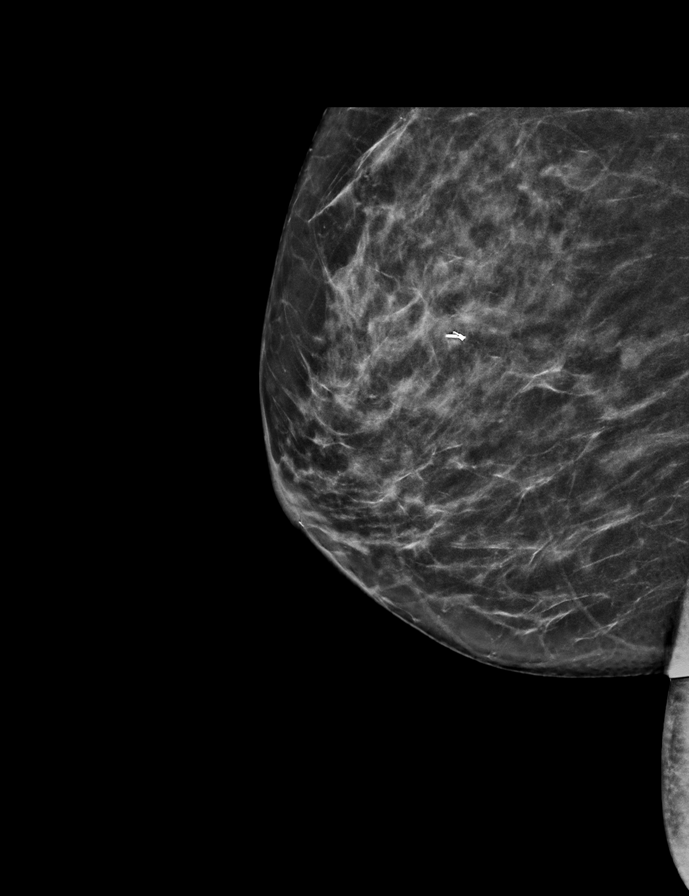

[8 of 23 positions shown; findings below may reference images not displayed]

FINDINGS: Patient presents for radioactive seed localization prior to right
breast excision. I met with the patient and we discussed the
procedure of seed localization including benefits and alternatives.
We discussed the high likelihood of a successful procedure. We
discussed the risks of the procedure including infection, bleeding,
tissue injury and further surgery. We discussed the low dose of
radioactivity involved in the procedure. Informed, written consent
was given.

The usual time-out protocol was performed immediately prior to the
procedure.

Using mammographic guidance, sterile technique, 1% lidocaine and an
N-8NB radioactive seed, the recently biopsied architectural
distortion and coil shaped biopsy marker clip in the outer right
breast were localized using a lateral approach. The follow-up
mammogram images confirm the seed in the expected location and were
marked for Dr. Omaryou.

Follow-up survey of the patient confirms presence of the radioactive
seed.

Order number of N-8NB seed:  .

Total activity:    Reference Date:

The patient tolerated the procedure well and was released from the
[REDACTED]. She was given instructions regarding seed removal.
IMPRESSION: Radioactive seed localization right breast. No apparent
complications.

## 2019-03-27 ENCOUNTER — Other Ambulatory Visit: Payer: Self-pay | Admitting: Obstetrics and Gynecology

## 2019-03-27 DIAGNOSIS — R928 Other abnormal and inconclusive findings on diagnostic imaging of breast: Secondary | ICD-10-CM

## 2019-03-29 ENCOUNTER — Ambulatory Visit
Admission: RE | Admit: 2019-03-29 | Discharge: 2019-03-29 | Disposition: A | Discharge status: Home / Self Care | Payer: 59 | Source: Ambulatory Visit | Attending: Obstetrics and Gynecology | Admitting: Obstetrics and Gynecology

## 2019-03-29 ENCOUNTER — Ambulatory Visit: Payer: 59

## 2019-03-29 ENCOUNTER — Other Ambulatory Visit: Payer: Self-pay

## 2019-03-29 DIAGNOSIS — R928 Other abnormal and inconclusive findings on diagnostic imaging of breast: Secondary | ICD-10-CM

## 2020-03-30 ENCOUNTER — Other Ambulatory Visit: Payer: Self-pay | Admitting: Specialist

## 2020-03-30 DIAGNOSIS — G4485 Primary stabbing headache: Secondary | ICD-10-CM

## 2020-03-30 DIAGNOSIS — G4482 Headache associated with sexual activity: Secondary | ICD-10-CM

## 2020-03-30 DIAGNOSIS — G4484 Primary exertional headache: Secondary | ICD-10-CM

## 2020-04-23 ENCOUNTER — Other Ambulatory Visit: Payer: 59

## 2022-04-20 ENCOUNTER — Other Ambulatory Visit: Payer: Self-pay | Admitting: Family Medicine

## 2022-04-20 DIAGNOSIS — Z122 Encounter for screening for malignant neoplasm of respiratory organs: Secondary | ICD-10-CM

## 2022-05-11 ENCOUNTER — Ambulatory Visit: Payer: 59

## 2022-05-25 ENCOUNTER — Ambulatory Visit: Payer: 59

## 2022-05-25 ENCOUNTER — Ambulatory Visit (INDEPENDENT_AMBULATORY_CARE_PROVIDER_SITE_OTHER): Payer: 59

## 2022-05-25 ENCOUNTER — Other Ambulatory Visit: Payer: Self-pay | Admitting: Family Medicine

## 2022-05-25 DIAGNOSIS — Z122 Encounter for screening for malignant neoplasm of respiratory organs: Secondary | ICD-10-CM | POA: Diagnosis not present

## 2022-05-25 DIAGNOSIS — Z87891 Personal history of nicotine dependence: Secondary | ICD-10-CM | POA: Diagnosis not present

## 2023-05-18 ENCOUNTER — Other Ambulatory Visit: Payer: Self-pay | Admitting: Family Medicine

## 2023-05-18 DIAGNOSIS — J432 Centrilobular emphysema: Secondary | ICD-10-CM

## 2023-05-24 ENCOUNTER — Ambulatory Visit: Payer: 59

## 2023-05-24 DIAGNOSIS — Z87891 Personal history of nicotine dependence: Secondary | ICD-10-CM | POA: Diagnosis not present

## 2023-05-24 DIAGNOSIS — Z122 Encounter for screening for malignant neoplasm of respiratory organs: Secondary | ICD-10-CM | POA: Diagnosis not present

## 2023-05-24 DIAGNOSIS — J432 Centrilobular emphysema: Secondary | ICD-10-CM

## 2024-03-15 ENCOUNTER — Encounter (HOSPITAL_BASED_OUTPATIENT_CLINIC_OR_DEPARTMENT_OTHER): Payer: Self-pay

## 2024-03-18 ENCOUNTER — Encounter (HOSPITAL_BASED_OUTPATIENT_CLINIC_OR_DEPARTMENT_OTHER): Payer: Self-pay

## 2024-03-18 ENCOUNTER — Institutional Professional Consult (permissible substitution) (HOSPITAL_BASED_OUTPATIENT_CLINIC_OR_DEPARTMENT_OTHER): Admitting: Internal Medicine

## 2024-04-15 ENCOUNTER — Institutional Professional Consult (permissible substitution) (HOSPITAL_BASED_OUTPATIENT_CLINIC_OR_DEPARTMENT_OTHER): Admitting: Internal Medicine

## 2024-04-25 ENCOUNTER — Institutional Professional Consult (permissible substitution) (HOSPITAL_BASED_OUTPATIENT_CLINIC_OR_DEPARTMENT_OTHER): Admitting: Internal Medicine

## 2024-06-07 ENCOUNTER — Encounter (HOSPITAL_BASED_OUTPATIENT_CLINIC_OR_DEPARTMENT_OTHER): Payer: Self-pay

## 2024-06-10 ENCOUNTER — Encounter (HOSPITAL_BASED_OUTPATIENT_CLINIC_OR_DEPARTMENT_OTHER): Payer: Self-pay

## 2024-06-10 ENCOUNTER — Institutional Professional Consult (permissible substitution) (HOSPITAL_BASED_OUTPATIENT_CLINIC_OR_DEPARTMENT_OTHER): Admitting: Internal Medicine

## 2024-06-28 ENCOUNTER — Encounter (HOSPITAL_BASED_OUTPATIENT_CLINIC_OR_DEPARTMENT_OTHER): Payer: Self-pay

## 2024-07-01 ENCOUNTER — Encounter (HOSPITAL_BASED_OUTPATIENT_CLINIC_OR_DEPARTMENT_OTHER): Payer: Self-pay | Admitting: Internal Medicine

## 2024-07-01 ENCOUNTER — Ambulatory Visit (INDEPENDENT_AMBULATORY_CARE_PROVIDER_SITE_OTHER): Admitting: Internal Medicine

## 2024-07-01 VITALS — BP 116/80 | HR 106 | Ht 64.0 in | Wt 188.7 lb

## 2024-07-01 DIAGNOSIS — Z720 Tobacco use: Secondary | ICD-10-CM | POA: Diagnosis not present

## 2024-07-01 DIAGNOSIS — J439 Emphysema, unspecified: Secondary | ICD-10-CM

## 2024-07-01 DIAGNOSIS — E782 Mixed hyperlipidemia: Secondary | ICD-10-CM | POA: Diagnosis not present

## 2024-07-01 DIAGNOSIS — E785 Hyperlipidemia, unspecified: Secondary | ICD-10-CM

## 2024-07-01 DIAGNOSIS — I700 Atherosclerosis of aorta: Secondary | ICD-10-CM

## 2024-07-01 MED ORDER — VARENICLINE TARTRATE (STARTER) 0.5 MG X 11 & 1 MG X 42 PO TBPK: ORAL_TABLET | ORAL | 0 refills | Status: AC

## 2024-07-01 MED ORDER — VARENICLINE TARTRATE 1 MG PO TABS: 1.0000 mg | ORAL_TABLET | Freq: Two times a day (BID) | ORAL | 2 refills | Status: AC

## 2024-07-01 NOTE — Patient Instructions (Addendum)
 Medication Instructions:  1.) chantix - take as directed:  Dosage Instructions Initial Dosage: Start taking Chantix one week before your quit date. The typical dosing schedule is as follows: Days 1 to 3: Take 0.5 mg once daily. Days 4 to 7: Take 0.5 mg twice daily (one in the morning and one in the evening). Days 8 until the end of treatment: Take 1 mg twice daily (one in the morning and one in the evening).   *If you need a refill on your cardiac medications before your next appointment, please call your pharmacy*  Lab Work: Your physician recommends that you return for lab work in: 6 months   NMR Lipoprofile and Lp(a)   Testing/Procedures: none  Follow-Up: As needed   Adopting a Healthy Lifestyle.   Weight: Know what a healthy weight is for you (roughly BMI <25) and aim to maintain this. You can calculate your body mass index on your smart phone. Unfortunately, this is not the most accurate measure of healthy weight, but it is the simplest measurement to use. A more accurate measurement involves body scanning which measures lean muscle, fat tissue and bony density. We do not have this equipment at [facility].    Diet: Aim for 7+ servings of fruits and vegetables daily Limit animal fats in diet for cholesterol and heart health - choose grass fed whenever available Avoid highly processed foods (fast food burgers, tacos, fried chicken, pizza, hot dogs, french fries)  Saturated fat comes in the form of butter, lard, coconut oil, margarine, partially hydrogenated oils, and fat in meat. These increase your risk of cardiovascular disease.  Use healthy plant oils, such as olive, canola, soy, corn, sunflower and peanut.  Whole foods such as fruits, vegetables and whole grains have fiber  Men need > 38 grams of fiber per day Women need > 25 grams of fiber per day  Load up on vegetables and fruits - one-half of your plate: Aim for color and variety, and remember that potatoes dont  count. Go for whole grains - one-quarter of your plate: Whole wheat, barley, wheat berries, quinoa, oats, brown rice, and foods made with them. If you want pasta, go with whole wheat pasta. Protein power - one-quarter of your plate: Fish, chicken, beans, and nuts are all healthy, versatile protein sources. Limit red meat. You need carbohydrates for energy! The type of carbohydrate is more important than the amount. Choose carbohydrates such as vegetables, fruits, whole grains, beans, and nuts in the place of white rice, white pasta, potatoes (baked or fried), macaroni and cheese, cakes, cookies, and donuts.  If youre thirsty, drink water. Coffee and tea are good in moderation, but skip sugary drinks and limit milk and dairy products to one or two daily servings. Keep sugar intake at 6 teaspoons or 24 grams or LESS       Exercise: Aim for 150 min of moderate intensity exercise weekly for heart health, and weights twice weekly for bone health Stay active - any steps are better than no steps! Aim for 7-9 hours of sleep daily

## 2024-07-01 NOTE — Progress Notes (Signed)
 LIPID CLINIC CONSULT NOTE  Chief Complaint:  Manage dyslipidemia  Primary Care Physician: Ashley Browning, MD  Primary Cardiologist:  None  HPI:  Ashley Sawyer is a 53 y.o. female who is being seen today for the evaluation of dyslipidemia at the request of Ashley Browning, MD. this a pleasant 53 year old female kindly referred for evaluation management dyslipidemia.  She has a history of high cholesterol with primarily high triglycerides.  Recently labs showed total cholesterol 225, triglycerides 285, HDL 46 and LDL 128.  She also has history of smoking in the past but quit in 2018.  Unfortunately she recently restarted a couple months ago due to a tragedy.  She does want to quit smoking.  She asked today about getting Chantix to help her with this.  I did look back as she has been receiving recent CT scans to survey for possible pulmonary nodules given her smoking history.  The scans have demonstrated aortic atherosclerosis but no definitive coronary calcium.  Based on this her target LDL should be less than 70.  She is not on any lipid-lowering therapy.  After some discussion today she notes that her diet has been less than ideal and she has been less active.  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   BREAST LUMPECTOMY WITH RADIOACTIVE SEED LOCALIZATION Right 04/25/2018   Procedure: RIGHT BREAST LUMPECTOMY WITH RADIOACTIVE SEED LOCALIZATION ERAS PATHWAY;  Surgeon: Ashley Favorite, MD;  Location: [facility];  Service: General;  Laterality: Right;   COLONOSCOPY     CYST REMOVAL NECK     LAPAROSCOPIC ASSISTED VAGINAL  HYSTERECTOMY N/A 09/12/2017   Procedure: LAPAROSCOPIC ASSISTED VAGINAL HYSTERECTOMY;  Surgeon: Ashley Browning, MD;  Location: [facility] ;  Service: Gynecology;  Laterality: N/A;  needs bed   WISDOM TOOTH EXTRACTION      FAMHx:  Family History  Problem Relation Age of Onset   Breast cancer Mother 49    SOCHx:   reports that she has been smoking cigarettes. She started smoking about 26 years ago. She has a 5 pack-year smoking history. She has never used smokeless tobacco. She reports current alcohol use. She reports that she does not use drugs.  ALLERGIES:  Allergies  Allergen Reactions   Other Nausea And Vomiting    Narcotics    ROS: Pertinent items noted in HPI and remainder of comprehensive ROS otherwise negative.  HOME MEDS: Current Outpatient Medications on File Prior to Visit  Medication Sig Dispense Refill   ALPRAZolam (XANAX) 0.5 MG tablet Take 0.5 mg by mouth 3 (three) times daily as needed for anxiety.     tretinoin (RETIN-A) 0.1 % cream Apply topically at bedtime as needed.     triamcinolone cream (KENALOG) 0.1 % APPLY TWICE DAILY AS NEEDED FOR INSECT BITES     valACYclovir (VALTREX) 1000 MG tablet Take 1,000 mg by mouth 2 (two) times daily as needed.     progesterone (PROMETRIUM) 100 MG capsule Take 100 mg by mouth daily. (Patient not taking: Reported on 07/01/2024)     varenicline (CHANTIX) 1 MG tablet  Take 1 mg by mouth daily. (Patient not taking: Reported on 07/01/2024)     No current facility-administered medications on file prior to visit.    LABS/IMAGING: No results found for this or any previous visit (from the past 48 hours). No results found.  LIPID PANEL: No results found for: CHOL, TRIG, HDL, CHOLHDL, VLDL, LDLCALC, LDLDIRECT  No results found for: LIPOA   WEIGHTS: Wt Readings from Last 3 Encounters:  07/01/24 188 lb 11.2 oz (85.6 kg)  05/24/23 180 lb (81.6 kg)  04/25/18 160 lb 3.2 oz (72.7 kg)    VITALS: BP  116/80   Pulse (!) 106   Ht 5' 4 (1.626 m)   Wt 188 lb 11.2 oz (85.6 kg)   LMP 08/25/2017 (Approximate)   SpO2 96%   BMI 32.39 kg/m   EXAM: Deferred  EKG: Deferred  ASSESSMENT: Dyslipidemia, goal LDL less than 70 Elevated triglycerides Aortic atherosclerosis Smoker with history of emphysema, desires to quit  PLAN: 1.   Ashley Sawyer is a dyslipidemia with target LDL less than 70.  Her triglycerides have been elevated as well.  She says her diet has been less than ideal and wants to work on that.  I provided some literature regarding healthy diet tips to lower cholesterol.  Will plan on 6 months with repeat lipids including NMR and LP(a).  In addition she is interested in smoking cessation.  She says that Chantix has been helpful in the past so I will prescribe that for her today including a starter pack and 2 follow-up packs.  We also have a link with QR code in the after visit summary that she can use which are resources through [facility] health in the state for smoking cessation.  Thanks again for the kind referral.  Ashley KYM Maxcy, MD, [facility], FNLA, FACP  [facility]  Medical Director of the [facility]id [facility]  Cardiovascular Risk Reduction Clinic Diplomate of the American Board of Clinical Lipidology Attending Cardiologist  Direct Dial: [phone]  Fax: [phone]  Website:  [email or web address] Sawyer Ashley Sawyer 07/01/2024, 3:52 PM
